# Patient Record
Sex: Female | Born: 1950 | Race: Black or African American | Hispanic: No | Smoking: Never smoker
Health system: Southern US, Community
[De-identification: ages and names within clinical notes are randomized; demographics above are authoritative.]

## PROBLEM LIST (undated history)

## (undated) DIAGNOSIS — N950 Postmenopausal bleeding: Secondary | ICD-10-CM

## (undated) DIAGNOSIS — R05 Cough: Secondary | ICD-10-CM

## (undated) DIAGNOSIS — Z8619 Personal history of other infectious and parasitic diseases: Secondary | ICD-10-CM

## (undated) DIAGNOSIS — M1990 Unspecified osteoarthritis, unspecified site: Secondary | ICD-10-CM

## (undated) DIAGNOSIS — Z9889 Other specified postprocedural states: Secondary | ICD-10-CM

## (undated) DIAGNOSIS — R112 Nausea with vomiting, unspecified: Secondary | ICD-10-CM

## (undated) DIAGNOSIS — I10 Essential (primary) hypertension: Secondary | ICD-10-CM

## (undated) DIAGNOSIS — Z8742 Personal history of other diseases of the female genital tract: Secondary | ICD-10-CM

## (undated) DIAGNOSIS — Z8739 Personal history of other diseases of the musculoskeletal system and connective tissue: Secondary | ICD-10-CM

## (undated) DIAGNOSIS — R32 Unspecified urinary incontinence: Secondary | ICD-10-CM

## (undated) DIAGNOSIS — E669 Obesity, unspecified: Secondary | ICD-10-CM

## (undated) DIAGNOSIS — B379 Candidiasis, unspecified: Secondary | ICD-10-CM

## (undated) DIAGNOSIS — R059 Cough, unspecified: Secondary | ICD-10-CM

## (undated) DIAGNOSIS — A499 Bacterial infection, unspecified: Secondary | ICD-10-CM

## (undated) DIAGNOSIS — I8390 Asymptomatic varicose veins of unspecified lower extremity: Secondary | ICD-10-CM

## (undated) DIAGNOSIS — N939 Abnormal uterine and vaginal bleeding, unspecified: Secondary | ICD-10-CM

## (undated) DIAGNOSIS — K219 Gastro-esophageal reflux disease without esophagitis: Secondary | ICD-10-CM

## (undated) DIAGNOSIS — R2241 Localized swelling, mass and lump, right lower limb: Secondary | ICD-10-CM

## (undated) DIAGNOSIS — E785 Hyperlipidemia, unspecified: Secondary | ICD-10-CM

## (undated) HISTORY — PX: BIOPSY BREAST: PRO8

## (undated) HISTORY — DX: Asymptomatic varicose veins of unspecified lower extremity: I83.90

## (undated) HISTORY — PX: WISDOM TOOTH EXTRACTION: SHX21

## (undated) HISTORY — DX: Candidiasis, unspecified: B37.9

## (undated) HISTORY — DX: Localized swelling, mass and lump, right lower limb: R22.41

## (undated) HISTORY — DX: Personal history of other infectious and parasitic diseases: Z86.19

## (undated) HISTORY — DX: Hyperlipidemia, unspecified: E78.5

## (undated) HISTORY — DX: Cough: R05

## (undated) HISTORY — PX: OTHER SURGICAL HISTORY: SHX169

## (undated) HISTORY — PX: BREAST REDUCTION SURGERY: SHX8

## (undated) HISTORY — DX: Obesity, unspecified: E66.9

## (undated) HISTORY — DX: Essential (primary) hypertension: I10

## (undated) HISTORY — DX: Unspecified urinary incontinence: R32

## (undated) HISTORY — DX: Cough, unspecified: R05.9

## (undated) HISTORY — DX: Personal history of other diseases of the female genital tract: Z87.42

## (undated) HISTORY — DX: Personal history of other diseases of the musculoskeletal system and connective tissue: Z87.39

## (undated) HISTORY — DX: Postmenopausal bleeding: N95.0

## (undated) HISTORY — DX: Abnormal uterine and vaginal bleeding, unspecified: N93.9

## (undated) HISTORY — DX: Bacterial infection, unspecified: A49.9

## (undated) HISTORY — PX: DILATION AND CURETTAGE OF UTERUS: SHX78

---

## 1975-02-21 HISTORY — PX: MYOMECTOMY ABDOMINAL APPROACH: SUR870

## 1987-02-21 HISTORY — PX: TUMOR REMOVAL: SHX12

## 1997-11-30 ENCOUNTER — Ambulatory Visit (HOSPITAL_COMMUNITY): Admission: RE | Admit: 1997-11-30 | Discharge: 1997-11-30 | Payer: Self-pay | Admitting: Specialist

## 1997-12-07 ENCOUNTER — Ambulatory Visit (HOSPITAL_BASED_OUTPATIENT_CLINIC_OR_DEPARTMENT_OTHER): Admission: RE | Admit: 1997-12-07 | Discharge: 1997-12-07 | Payer: Self-pay | Admitting: Specialist

## 1999-02-21 DIAGNOSIS — Z8742 Personal history of other diseases of the female genital tract: Secondary | ICD-10-CM

## 1999-02-21 HISTORY — DX: Personal history of other diseases of the female genital tract: Z87.42

## 1999-07-30 ENCOUNTER — Encounter: Payer: Self-pay | Admitting: Nephrology

## 1999-07-30 ENCOUNTER — Encounter: Admission: RE | Admit: 1999-07-30 | Discharge: 1999-07-30 | Payer: Self-pay | Admitting: Nephrology

## 1999-08-01 ENCOUNTER — Encounter: Payer: Self-pay | Admitting: Nephrology

## 1999-08-01 ENCOUNTER — Encounter: Admission: RE | Admit: 1999-08-01 | Discharge: 1999-08-01 | Payer: Self-pay | Admitting: Nephrology

## 1999-08-09 ENCOUNTER — Ambulatory Visit (HOSPITAL_COMMUNITY): Admission: RE | Admit: 1999-08-09 | Discharge: 1999-08-09 | Payer: Self-pay | Admitting: Nephrology

## 1999-08-09 ENCOUNTER — Encounter: Payer: Self-pay | Admitting: Nephrology

## 1999-10-04 ENCOUNTER — Encounter: Admission: RE | Admit: 1999-10-04 | Discharge: 1999-10-04 | Payer: Self-pay | Admitting: Nephrology

## 1999-10-04 ENCOUNTER — Encounter: Payer: Self-pay | Admitting: Nephrology

## 1999-10-26 ENCOUNTER — Encounter: Payer: Self-pay | Admitting: Nephrology

## 1999-10-26 ENCOUNTER — Encounter: Admission: RE | Admit: 1999-10-26 | Discharge: 1999-10-26 | Payer: Self-pay | Admitting: Nephrology

## 2000-01-04 ENCOUNTER — Encounter: Payer: Self-pay | Admitting: Emergency Medicine

## 2000-01-04 ENCOUNTER — Emergency Department (HOSPITAL_COMMUNITY): Admission: EM | Admit: 2000-01-04 | Discharge: 2000-01-04 | Payer: Self-pay | Admitting: Emergency Medicine

## 2000-02-21 DIAGNOSIS — Z8742 Personal history of other diseases of the female genital tract: Secondary | ICD-10-CM

## 2000-02-21 HISTORY — DX: Personal history of other diseases of the female genital tract: Z87.42

## 2000-09-27 ENCOUNTER — Other Ambulatory Visit: Admission: RE | Admit: 2000-09-27 | Discharge: 2000-09-27 | Payer: Self-pay | Admitting: Obstetrics and Gynecology

## 2000-12-27 ENCOUNTER — Other Ambulatory Visit: Admission: RE | Admit: 2000-12-27 | Discharge: 2000-12-27 | Payer: Self-pay | Admitting: Obstetrics and Gynecology

## 2004-02-21 DIAGNOSIS — Z8742 Personal history of other diseases of the female genital tract: Secondary | ICD-10-CM

## 2004-02-21 HISTORY — DX: Personal history of other diseases of the female genital tract: Z87.42

## 2004-09-16 ENCOUNTER — Other Ambulatory Visit: Admission: RE | Admit: 2004-09-16 | Discharge: 2004-09-16 | Payer: Self-pay | Admitting: Obstetrics and Gynecology

## 2007-01-02 LAB — CONVERTED CEMR LAB

## 2008-07-23 ENCOUNTER — Encounter: Payer: Self-pay | Admitting: Family Medicine

## 2008-09-15 ENCOUNTER — Encounter: Payer: Self-pay | Admitting: Family Medicine

## 2008-12-31 ENCOUNTER — Ambulatory Visit: Payer: Self-pay | Admitting: Family Medicine

## 2008-12-31 DIAGNOSIS — H918X9 Other specified hearing loss, unspecified ear: Secondary | ICD-10-CM

## 2008-12-31 DIAGNOSIS — E785 Hyperlipidemia, unspecified: Secondary | ICD-10-CM

## 2008-12-31 DIAGNOSIS — E119 Type 2 diabetes mellitus without complications: Secondary | ICD-10-CM

## 2008-12-31 DIAGNOSIS — I10 Essential (primary) hypertension: Secondary | ICD-10-CM | POA: Insufficient documentation

## 2009-01-01 LAB — CONVERTED CEMR LAB
ALT: 18 units/L (ref 0–35)
AST: 19 units/L (ref 0–37)
Albumin: 3.6 g/dL (ref 3.5–5.2)
Alkaline Phosphatase: 75 units/L (ref 39–117)
BUN: 10 mg/dL (ref 6–23)
Bilirubin, Direct: 0 mg/dL (ref 0.0–0.3)
CO2: 29 meq/L (ref 19–32)
Calcium: 9 mg/dL (ref 8.4–10.5)
Chloride: 101 meq/L (ref 96–112)
Cholesterol: 116 mg/dL (ref 0–200)
Creatinine, Ser: 0.9 mg/dL (ref 0.4–1.2)
GFR calc non Af Amer: 82.9 mL/min (ref >60)
Glucose, Bld: 207 mg/dL — ABNORMAL HIGH (ref 70–99)
HDL: 43.4 mg/dL (ref >39.00)
Hgb A1c MFr Bld: 7.8 % — ABNORMAL HIGH (ref 4.6–6.5)
LDL Cholesterol: 48 mg/dL (ref 0–99)
Potassium: 3.2 meq/L — ABNORMAL LOW (ref 3.5–5.1)
Sodium: 138 meq/L (ref 135–145)
Total Bilirubin: 0.7 mg/dL (ref 0.3–1.2)
Total CHOL/HDL Ratio: 3
Total Protein: 7.3 g/dL (ref 6.0–8.3)
Triglycerides: 122 mg/dL (ref 0.0–149.0)
VLDL: 24.4 mg/dL (ref 0.0–40.0)

## 2009-01-19 ENCOUNTER — Encounter: Payer: Self-pay | Admitting: Family Medicine

## 2009-03-10 ENCOUNTER — Encounter (INDEPENDENT_AMBULATORY_CARE_PROVIDER_SITE_OTHER): Payer: Self-pay | Admitting: *Deleted

## 2009-03-12 ENCOUNTER — Telehealth (INDEPENDENT_AMBULATORY_CARE_PROVIDER_SITE_OTHER): Payer: Self-pay | Admitting: *Deleted

## 2009-04-09 ENCOUNTER — Telehealth: Payer: Self-pay | Admitting: Family Medicine

## 2009-04-09 ENCOUNTER — Ambulatory Visit: Payer: Self-pay | Admitting: Family Medicine

## 2009-04-09 ENCOUNTER — Other Ambulatory Visit: Admission: RE | Admit: 2009-04-09 | Discharge: 2009-04-09 | Payer: Self-pay | Admitting: Family Medicine

## 2009-04-09 DIAGNOSIS — K069 Disorder of gingiva and edentulous alveolar ridge, unspecified: Secondary | ICD-10-CM

## 2009-04-09 DIAGNOSIS — Z862 Personal history of diseases of the blood and blood-forming organs and certain disorders involving the immune mechanism: Secondary | ICD-10-CM

## 2009-04-09 DIAGNOSIS — K056 Periodontal disease, unspecified: Secondary | ICD-10-CM | POA: Insufficient documentation

## 2009-04-09 DIAGNOSIS — Z8639 Personal history of other endocrine, nutritional and metabolic disease: Secondary | ICD-10-CM

## 2009-04-12 ENCOUNTER — Encounter (INDEPENDENT_AMBULATORY_CARE_PROVIDER_SITE_OTHER): Payer: Self-pay | Admitting: *Deleted

## 2009-04-12 LAB — CONVERTED CEMR LAB
BUN: 6 mg/dL (ref 6–23)
CO2: 30 meq/L (ref 19–32)
Calcium: 9.2 mg/dL (ref 8.4–10.5)
Chloride: 101 meq/L (ref 96–112)
Creatinine, Ser: 0.8 mg/dL (ref 0.4–1.2)
GFR calc non Af Amer: 94.88 mL/min (ref >60)
Glucose, Bld: 138 mg/dL — ABNORMAL HIGH (ref 70–99)
Hgb A1c MFr Bld: 7.8 % — ABNORMAL HIGH (ref 4.6–6.5)
Potassium: 3.5 meq/L (ref 3.5–5.1)
Sodium: 136 meq/L (ref 135–145)

## 2009-04-15 ENCOUNTER — Encounter (INDEPENDENT_AMBULATORY_CARE_PROVIDER_SITE_OTHER): Payer: Self-pay | Admitting: *Deleted

## 2009-04-15 LAB — CONVERTED CEMR LAB: Pap Smear: NEGATIVE

## 2009-04-21 ENCOUNTER — Telehealth: Payer: Self-pay | Admitting: Family Medicine

## 2009-04-26 ENCOUNTER — Encounter: Payer: Self-pay | Admitting: Family Medicine

## 2009-06-23 ENCOUNTER — Ambulatory Visit: Payer: Self-pay | Admitting: Family Medicine

## 2009-06-23 DIAGNOSIS — M546 Pain in thoracic spine: Secondary | ICD-10-CM

## 2009-07-09 ENCOUNTER — Ambulatory Visit: Payer: Self-pay | Admitting: Family Medicine

## 2009-07-09 ENCOUNTER — Ambulatory Visit: Payer: Self-pay | Admitting: Internal Medicine

## 2009-07-16 ENCOUNTER — Encounter: Payer: Self-pay | Admitting: Family Medicine

## 2009-08-30 ENCOUNTER — Ambulatory Visit: Payer: Self-pay | Admitting: Family Medicine

## 2009-08-30 ENCOUNTER — Telehealth: Payer: Self-pay | Admitting: Family Medicine

## 2009-08-30 DIAGNOSIS — J020 Streptococcal pharyngitis: Secondary | ICD-10-CM

## 2009-08-30 DIAGNOSIS — J029 Acute pharyngitis, unspecified: Secondary | ICD-10-CM | POA: Insufficient documentation

## 2009-08-30 LAB — CONVERTED CEMR LAB: Rapid Strep: POSITIVE

## 2009-09-21 ENCOUNTER — Encounter (INDEPENDENT_AMBULATORY_CARE_PROVIDER_SITE_OTHER): Payer: Self-pay | Admitting: *Deleted

## 2009-10-07 ENCOUNTER — Ambulatory Visit: Payer: Self-pay | Admitting: Family Medicine

## 2009-10-11 ENCOUNTER — Encounter: Payer: Self-pay | Admitting: Family Medicine

## 2009-11-09 ENCOUNTER — Telehealth: Payer: Self-pay | Admitting: Family Medicine

## 2009-11-10 ENCOUNTER — Encounter: Payer: Self-pay | Admitting: Family Medicine

## 2010-03-22 NOTE — Progress Notes (Signed)
----   Converted from flag ---- ---- 04/09/2009 10:57 AM, Ruthe Mannan MD wrote: Thanks [employer]  Please convert to note.    ---- 04/09/2009 10:57 AM, Carlton Adam wrote: Zella Ball refaxed the original request from November to her previous Drs office. She called and spoke to them also. MK  ---- 04/09/2009 9:39 AM, Ruthe Mannan MD wrote: Alvira Philips, Can you try to find out if her previous records have been sent yet? Thanks, Talia ------------------------------

## 2010-03-22 NOTE — Letter (Signed)
Summary: Generic Letter  [place] at [facility]  [address] [place], [place] [address]   Phone: [phone]  Fax: [phone]    10/11/2009  Gabrielle Green [address] [place], [place]  [address]  Dear Ms. Palazzola,  We have been unable to reach you by telephone.  Please call our office at your earliest convenience.  When calling ask to speak with Lowella Bandy, Medical Assistant for Dr. Dayton Martes.         Sincerely,      Ruthe Mannan, MD

## 2010-03-22 NOTE — Progress Notes (Signed)
Summary: needs note for jury duty  Phone Note Call from Patient Call back at Home Phone [phone]   Caller: Patient Call For: Ruthe Mannan MD Summary of Call: Pt is requesting a letter excusing her from jury duty due to her being a diabetic, with drops in her blood sugar requiring her to eat.  She needs note by 12/20/09. Initial call taken by: Lowella Petties CMA,  November 09, 2009 2:47 PM  Follow-up for Phone Call        Note on my desk. Ruthe Mannan MD  November 10, 2009 12:12 PM  Left message on machine at home for patient to return call.  Note for jury duty left at front desk.  Linde Gillis CMA Duncan Dull)  November 10, 2009 12:26 PM   Advised pt.              Lowella Petties CMA  November 10, 2009 4:20 PM

## 2010-03-22 NOTE — Miscellaneous (Signed)
Summary: BONE DENSITY  Clinical Lists Changes  Orders: Added new Test order of T-Bone Densitometry (77080) - Signed Added new Test order of T-Lumbar Vertebral Assessment (77082) - Signed 

## 2010-03-22 NOTE — Letter (Signed)
Summary: Out of Work  [employer] at [facility]  [address] [place], [place] [address]   Phone: [phone]  Fax: [phone]    November 10, 2009   Employee:  AMBRIA MAYFIELD    To Whom It May Concern:   For Medical reasons, please excuse Ms. Carthen from jury duty.  Has poorly controlled diabetes and requires frequent meals and blood sugar checks.   If you need additional information, please feel free to contact our office.         Sincerely,    Ruthe Mannan MD

## 2010-03-22 NOTE — Letter (Signed)
Summary: [place] No Show Letter  [place] at [facility]  [address] [place], [place] [address]   Phone: [phone]  Fax: [phone]    09/21/2009 MRN: [id number]  [facility] [employer] [address] [place], [place]  [address]   Dear Ms. Struthers,   Our records indicate that you missed your scheduled appointment with ___lab_________________ on _8.2.11___________.  Please contact this office to reschedule your appointment as soon as possible.  It is important that you keep your scheduled appointments with your physician, so we can provide you the best care possible.  Please be advised that there may be a charge for "no show" appointments.    Sincerely,   [place] at [facility]

## 2010-03-22 NOTE — Letter (Signed)
Summary: Results Follow up Letter  [place] at [facility]  [address] [place], [place] [address]   Phone: [phone]  Fax: [phone]    04/15/2009 MRN: [id number]    Gabrielle Green [address] [place], [place]  [address]    Dear Ms. Evitts,  The following are the results of your recent test(s):  Test         Result    Pap Smear:        Normal __X___  Not Normal _____ Comments:   Repeat in 2 years. ______________________________________________________ Cholesterol: LDL(Bad cholesterol):         Your goal is less than:         HDL (Good cholesterol):       Your goal is more than: Comments:  ______________________________________________________ Mammogram:        Normal _____  Not Normal _____ Comments:  ___________________________________________________________________ Hemoccult:        Normal _____  Not normal _______ Comments:    _____________________________________________________________________ Other Tests:    We routinely do not discuss normal results over the telephone.  If you desire a copy of the results, or you have any questions about this information we can discuss them at your next office visit.   Sincerely,    Ruthe Mannan,  M.D.  TA:lsf

## 2010-03-22 NOTE — Letter (Signed)
Summary: Results Follow up Letter  [place] at [facility]  [address] [place], [place] [address]   Phone: [phone]  Fax: [phone]    07/16/2009 MRN: [id number]  Gabrielle Green [address] [place], [place]  [address]  Dear Ms. Culbreth,  The following are the results of your recent test(s):  Test         Result    Pap Smear:        Normal _____  Not Normal _____ Comments: ______________________________________________________ Cholesterol: LDL(Bad cholesterol):         Your goal is less than:         HDL (Good cholesterol):       Your goal is more than: Comments:  ______________________________________________________ Mammogram:        Normal _____  Not Normal _____ Comments:  ___________________________________________________________________ Hemoccult:        Normal _____  Not normal _______ Comments:    _____________________________________________________________________ Other Tests: Your Bone Density test was normal.    We routinely do not discuss normal results over the telephone.  If you desire a copy of the results, or you have any questions about this information we can discuss them at your next office visit.   Sincerely,        Ruthe Mannan, MD

## 2010-03-22 NOTE — Progress Notes (Signed)
Summary: needs to change abx  Phone Note Call from Patient Call back at Home Phone [phone]   Caller: Patient Call For: Ruthe Mannan MD Summary of Call: Pt was seen this morning and given pcn for a sore throat.  This is causing nausea and pt is asking if something else can be called to [facility] [address]. Initial call taken by: Lowella Petties CMA,  August 30, 2009 4:13 PM  Follow-up for Phone Call        Patient advised as instructed via telephone.   Follow-up by: Linde Gillis CMA Duncan Dull),  August 31, 2009 7:58 AM    New/Updated Medications: AZITHROMYCIN 250 MG  TABS (AZITHROMYCIN) 2 by  mouth today and then 1 daily for 4 days Prescriptions: AZITHROMYCIN 250 MG  TABS (AZITHROMYCIN) 2 by  mouth today and then 1 daily for 4 days  #6 x 0   Entered and Authorized by:   Ruthe Mannan MD   Signed by:   Ruthe Mannan MD on 08/31/2009   Method used:   [facility]        [facility] [address]       [address], [address]       [place], [place]  [address]       Ph: [phone]       Fax: [phone]   RxID:   [id number]

## 2010-03-22 NOTE — Progress Notes (Signed)
Summary: wants phone call about letter  Phone Note Call from Patient Call back at Home Phone [phone]   Caller: Patient Call For: Ruthe Mannan MD Summary of Call: Patient called regarding letter that was sent to her about adding a medication. She wants a phone call back tomorrow (03-25-09) after 4 pm Initial call taken by: Melody Comas,  April 21, 2009 4:51 PM  Follow-up for Phone Call        spoke with patient.  She is okay with starting the additional diabetic medication that you suggested on her last labs.  I could never reach her by phone and had to send a letter.  Please send medication to [place], [employer].  Lab appointment is already scheduled. Follow-up by: Delilah Shan CMA (AAMA),  April 22, 2009 4:21 PM    New/Updated Medications: GLYBURIDE 2.5 MG TABS (GLYBURIDE) 1 tab by mouth daily. Prescriptions: GLYBURIDE 2.5 MG TABS (GLYBURIDE) 1 tab by mouth daily.  #30 x 3   Entered and Authorized by:   Ruthe Mannan MD   Signed by:   Ruthe Mannan MD on 04/26/2009   Method used:   [facility]        [facility] [address]       [address], [address]       [place], [place]  [address]       Ph: [phone]       Fax: [phone]   RxID:   [id number]

## 2010-03-22 NOTE — Letter (Signed)
Summary: Generic Letter  [place] at [facility]  [address] [place], [place] [address]   Phone: [phone]  Fax: [phone]    04/12/2009    Gabrielle Green [address] [place], [place]  [address]    Dear [name],   We were unable to reach you by phone.  Your A1C is still elevated at 7.8.  I would like to add a second diabetic medication if you are okay with that. You can either make an appt to discuss it or I can call you back to discuss.  Please call in and ask to speak to [name].  We need to recheck your blood in 3 months.  That lab appointment is scheduled for Jul 09, 2009 at 8:15 a.m. (non-fasting).  If this appointment is not convenient, please call in to the office to reschedule.  This is for lab work only.      Sincerely,   Ruthe Mannan,  M.D.  TA:lsf

## 2010-03-22 NOTE — Assessment & Plan Note (Signed)
Summary: SORE THROAT   Vital Signs:  Patient profile:   60 year old female Height:      65 inches Weight:      272.50 pounds BMI:     45.51 Temp:     101.4 degrees F oral Pulse rate:   80 / minute Pulse rhythm:   regular BP sitting:   110 / 70  (right arm) Cuff size:   large  Vitals Entered By: Linde Gillis CMA Duncan D[place]) (August 30, 2009 9:54 AM) CC: sore throat, body aches, ear pain   History of Present Illness: 60 yo here for sore throat, fever x 3 days. Tmax 101.5. Feels tired and achy. No cough, runny nose, wheezing or SOB.   Current Medications (verified): 1)  Metformin Hcl 500 Mg Tabs (Metformin Hcl) .... Take 1 Tablet By Mouth Two Times A Day 2)  Triamterene-Hctz 37.5-25 Mg Tabs (Triamterene-Hctz) .... Take 1 Tablet By Mouth Once A Day 3)  Simvastatin 40 Mg Tabs (Simvastatin) .... Take 1 Tablet By Mouth Once A Day 4)  Aspirin 81 Mg  Tabs (Aspirin) .... Take 1 Tablet By Mouth Once A Day 5)  Potassium Chloride Cr 10 Meq  Tbcr (Potassium Chloride) .Marland Kitchen.. 1 Tab By Mouth Daily X 1 Week. 6)  Diabeta 2.5 Mg Tabs (Glyburide) .Marland Kitchen.. 1 Tab By Mouth Daily. 7)  Penicillin V Potassium 500 Mg Tabs (Penicillin V Potassium) .Marland Kitchen.. 1 Tab By Mouth Three Times A Day X 10 Days 8)  Ibuprofen 600 Mg  Tabs (Ibuprofen) .Marland Kitchen.. 1 Tab By Mouth Three Times A Day As Needed Pain.  Allergies (verified): No Known Drug Allergies  Past History:  Past Medical History: Last updated: January 15, 2009 Diabetes mellitus, type II Hyperlipidemia Hypertension Obesity  Past Surgical History: Last updated: 01/15/2009 Breast reduction -1995  Family History: Last updated: 2009/01/15 Dad- had HTN, died of MI at age 69 Mom - alive, 68, h/o bresat CA in 76s, DM, h/o CVA, HTN  Social History: Last updated: 01-15-09 LIves with adopted son in [place], [place].  Teaching assitant for elementary school.   Never Smoked Alcohol use-no Drug use-no Regular exercise-no  Risk Factors: Exercise: no (2009-01-15)  Risk  Factors: Smoking Status: never (January 15, 2009)  Review of Systems      See HPI General:  Complains of fever and malaise. ENT:  Complains of sore throat; denies ear discharge, nasal congestion, and postnasal drainage. Resp:  Denies cough.  Physical Exam  General:  obese, very pleasant, NAD,obviously uncomfortable but non toxic. febrile Mouth:  pharyngeal erythema and pharyngeal exudate.   Lungs:  Normal respiratory effort, chest expands symmetrically. Lungs are clear to auscultation, no crackles or wheezes. Heart:  Normal rate and regular rhythm. S1 and S2 normal without gallop, murmur, click, rub or other extra sounds. Cervical Nodes:  R posterior LN enlarged.   Psych:  normally interactive, good eye contact, not anxious appearing, and not depressed appearing.     Impression & Recommendations:  Problem # 1:  STREPTOCOCCAL PHARYNGITIS (ICD-034.0) Assessment New Rapid strep positive with all cardinal symptoms of strep throat. Treat with 10 day course of PCN and Ibuprofen for pain, swelling and fever. Her updated medication list for this problem includes:    Aspirin 81 Mg Tabs (Aspirin) .Marland Kitchen... Take 1 tablet by mouth once a day    Penicillin V Potassium 500 Mg Tabs (Penicillin v potassium) .Marland Kitchen... 1 tab by mouth three times a day x 10 days    Ibuprofen 600 Mg Tabs (Ibuprofen) .Marland Kitchen... 1 tab by mouth  three times a day as needed pain.  Complete Medication List: 1)  Metformin Hcl 500 Mg Tabs (Metformin hcl) .... Take 1 tablet by mouth two times a day 2)  Triamterene-hctz 37.5-25 Mg Tabs (Triamterene-hctz) .... Take 1 tablet by mouth once a day 3)  Simvastatin 40 Mg Tabs (Simvastatin) .... Take 1 tablet by mouth once a day 4)  Aspirin 81 Mg Tabs (Aspirin) .... Take 1 tablet by mouth once a day 5)  Potassium Chloride Cr 10 Meq Tbcr (Potassium chloride) .Marland Kitchen.. 1 tab by mouth daily x 1 week. 6)  Diabeta 2.5 Mg Tabs (Glyburide) .Marland Kitchen.. 1 tab by mouth daily. 7)  Penicillin V Potassium 500 Mg Tabs  (Penicillin v potassium) .Marland Kitchen.. 1 tab by mouth three times a day x 10 days 8)  Ibuprofen 600 Mg Tabs (Ibuprofen) .Marland Kitchen.. 1 tab by mouth three times a day as needed pain.  Other Orders: Rapid Strep ([address]) Prescriptions: IBUPROFEN 600 MG  TABS (IBUPROFEN) 1 tab by mouth three times a day as needed pain.  #30 x 0   Entered and Authorized by:   Ruthe Mannan MD   Signed by:   Ruthe Mannan MD on 08/30/2009   Method used:   [facility]        [facility] [address]       [address], [address]       [place], [place]  [address]       Ph: [phone]       Fax: [phone]   RxID:   [id number] PENICILLIN V POTASSIUM 500 MG TABS (PENICILLIN V POTASSIUM) 1 tab by mouth three times a day x 10 days  #30 x 0   Entered and Authorized by:   Ruthe Mannan MD   Signed by:   Ruthe Mannan MD on 08/30/2009   Method used:   [facility]        [facility] [address]       [address], [address] [place] [address]       [place], [place]  [address]       Ph: [phone]       Fax: [phone]   RxID:   [id number]   Current Allergies (reviewed today): No known allergies   Laboratory Results    Other Tests  Rapid Strep: positive  Kit Test Internal QC: Positive   (Normal Range: Negative)

## 2010-03-22 NOTE — Assessment & Plan Note (Signed)
Summary: cpx/mk   Vital Signs:  Patient profile:   60 year old female Height:      65 inches Weight:      277.38 pounds BMI:     46.33 Temp:     98.7 degrees F oral Pulse rate:   84 / minute Pulse rhythm:   regular BP sitting:   132 / 82  (left arm) Cuff size:   large  Vitals Entered By: Delilah Shan CMA Duncan Dull) (April 09, 2009 9:03 AM) CC: CPX   History of Present Illness: 60 yo female here for CPX/pap with complaint of left gum abscess.  Left gum abscess- saw a dentist two days ago, placed her on PCN and given pain medication. Since then, has had fevers and chills, left side of face swollen and red.  Unable to get back into see dentist today.  No n/v/d.   DM-   On Metformin 500 mg two times a day.  No side effects.  Checks CBGs every other day, usually in evening- 100-120.  Does not usually check fasting.  .  No tingling or numbness in extremities. Careful with wearing shoes at all times.  Not currently on an ACEI, she is not sure if she had a reaction in past (still awaiting records from urgent care who started her on current antihypertensive). No blurred vision.  a2c in Novmeber 2010 was 7.8.  HTN- has been on Triamt/HCTZ 37.5 for years.  No adverse effects that she is aware of.  No CP, SOB, LE edema, or blurred vision.  HLD- has been on simvastain 40 mg for years.  No myalgias.  Stong FH of CAD- father died of MI at young age.  FLP at goal 12/2008.  Well woman- mammogram scheduled for tomorrow.  G0.  Never had a h/o abnormal paps. Last pap in 2008.  Has not been sexually active in years.  No FH cervical CA, ovarian, CA.   Current Medications (verified): 1)  Metformin Hcl 500 Mg Tabs (Metformin Hcl) .... Take 1 Tablet By Mouth Two Times A Day 2)  Triamterene-Hctz 37.5-25 Mg Tabs (Triamterene-Hctz) .... Take 1 Tablet By Mouth Once A Day 3)  Simvastatin 40 Mg Tabs (Simvastatin) .... Take 1 Tablet By Mouth Once A Day 4)  Aspirin 81 Mg  Tabs (Aspirin) .... Take 1 Tablet By  Mouth Once A Day 5)  Potassium Chloride Cr 10 Meq  Tbcr (Potassium Chloride) .Marland Kitchen.. 1 Tab By Mouth Daily X 1 Week. 6)  Cleocin 300 Mg Caps (Clindamycin Hcl) .... Tab By Mouth Q 6 Hours X 7 Days  Allergies (verified): No Known Drug Allergies  Review of Systems      See HPI General:  Complains of chills and fever; denies malaise. Eyes:  Denies blurring. ENT:  Denies difficulty swallowing. CV:  Denies chest pain or discomfort, difficulty breathing at night, swelling of feet, and swelling of hands. Resp:  Denies cough. GI:  Denies abdominal pain, nausea, and vomiting. GU:  Denies discharge and dysuria. Derm:  Denies rash. Neuro:  Denies headaches, sensation of room spinning, tingling, and visual disturbances. Psych:  Denies anxiety and depression.  Physical Exam  General:  obese, very pleasant, NAD,obviously uncomfortable but non toxic. Afebrile. Ears:  External ear exam shows no significant lesions or deformities.  Otoscopic examination reveals clear canals, tympanic membranes are intact bilaterally without bulging, retraction, inflammation or discharge. Hearing is grossly normal bilaterally. Mouth:  left side of jaw swollen, red, warm and tender to touch, left lower gum  swollen Breasts:  No mass, nodules, thickening, tenderness, bulging, retraction, inflamation, nipple discharge or skin changes noted.   Lungs:  Normal respiratory effort, chest expands symmetrically. Lungs are clear to auscultation, no crackles or wheezes. Heart:  Normal rate and regular rhythm. S1 and S2 normal without gallop, murmur, click, rub or other extra sounds. Abdomen:  Bowel sounds positive,abdomen soft and non-tender without masses, organomegaly or hernias noted. Genitalia:  Pelvic Exam:        External: normal female genitalia without lesions or masses        Vagina: normal without lesions or masses        Cervix: normal without lesions or masses        Adnexa: normal bimanual exam without masses or  fullness        Uterus: normal by palpation        Pap smear: performed Extremities:  No clubbing, cyanosis, edema, or deformity noted with normal full range of motion of all joints.   Skin:  Intact without suspicious lesions or rashes Psych:  normally interactive, good eye contact, not anxious appearing, and not depressed appearing.     Impression & Recommendations:  Problem # 1:  Preventive Health Care (ICD-V70.0) Reviewed preventive care protocols, scheduled due services, and updated immunizations Discussed nutrition, exercise, diet, and healthy lifestyle.  Pap today. Mammogram tomorrow. All other prevention UTD.  Problem # 2:  DIABETES MELLITUS, TYPE II (ICD-250.00) Assessment: Unchanged Recheck a1c today, continue Metformin 500 two times a day.  Her updated medication list for this problem includes:    Metformin Hcl 500 Mg Tabs (Metformin hcl) .Marland Kitchen... Take 1 tablet by mouth two times a day    Aspirin 81 Mg Tabs (Aspirin) .Marland Kitchen... Take 1 tablet by mouth once a day  Problem # 3:  HYPERTENSION (ICD-401.9) Assessment: Unchanged  Close to goal of <130/80.  Would like to add ACEI since she is a diabetic, never received old records.  Will ask front office staff to call about records. Her updated medication list for this problem includes:    Triamterene-hctz 37.5-25 Mg Tabs (Triamterene-hctz) .Marland Kitchen... 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  D/c PCN and start clindamycin as abscess has worsened with fevers since starting PCN.  Red flags given for immediate f/u over weekend.  Problem # 5:  HYPOKALEMIA, HX OF (ICD-V12.2) Assessment: New recheck BMET today.  Complete Medication List: 1)  Metformin Hcl 500 Mg Tabs (Metformin hcl) .... Take 1 tablet by mouth two times a day 2)  Triamterene-hctz 37.5-25 Mg Tabs (Triamterene-hctz) ....  Take 1 tablet by mouth once a day 3)  Simvastatin 40 Mg Tabs (Simvastatin) .... Take 1 tablet by mouth once a day 4)  Aspirin 81 Mg Tabs (Aspirin) .... Take 1 tablet by mouth once a day 5)  Potassium Chloride Cr 10 Meq Tbcr (Potassium chloride) .Marland Kitchen.. 1 tab by mouth daily x 1 week. 6)  Cleocin 300 Mg Caps (Clindamycin hcl) .... Tab by mouth q 6 hours x 7 days  Other Orders: Pap Smear, Thin Prep ( Collection of) (Q0091) TLB-A1C / Hgb A1C (Glycohemoglobin) (83036-A1C)  Patient Instructions: 1)  Please stop taking the penicillin and start taking Clindamycin as directed. 2)  Go to ER or urgent care over the weekend if you develop worsening fevers and chills. 3)  No n/v/d. Prescriptions: CLEOCIN 300 MG CAPS (CLINDAMYCIN HCL) tab  by mouth q 6 hours x 7 days  #28 x 0   Entered and Authorized by:   Ruthe Mannan MD   Signed by:   Ruthe Mannan MD on 04/09/2009   Method used:   [facility]ronically to        [facility] [address]       [address], [address]       [place], [place]  [address]       Ph: [phone]       Fax: [phone]   RxID:   [id number]   Current Allergies (reviewed today): No known allergies

## 2010-03-22 NOTE — Letter (Signed)
Summary: Generic Letter  [place] at [facility]  [address] [place], [place] [address]   Phone: [phone]  Fax: [phone]    03/10/2009    HADYN AZER [address] [place], [place]  [address]    Dear Ms. Berrios,  I have left a few messages asking that you return my call with no response.  Dr. Dayton Martes would like for you to make an appointment with her at your convenience.  Please call in to [phone] to schedule that appointment.   Sincerely,   [name] Fuquay CMA (AAMA)

## 2010-03-22 NOTE — Assessment & Plan Note (Signed)
Summary: REFILL MEDS / LFW   Vital Signs:  Patient profile:   60 year old female Height:      65 inches Weight:      283 pounds BMI:     47.26 Temp:     97.6 degrees F oral Pulse rate:   80 / minute Pulse rhythm:   regular BP sitting:   128 / 80  (left arm) Cuff size:   large  Vitals Entered By: Delilah Shan CMA Duncan Dull) (Jun 23, 2009 3:56 PM) CC: Refill meds.  (See HPI)   History of Present Illness: 60 yo female here for refill meds.   DM-   On Metformin 500 mg two times a day.  No side effects.  Checks CBGs every other day, usually in evening- 100-120.  Does not usually check fasting.  .  No tingling or numbness in extremities. Careful with wearing shoes at all times.  Not currently on an ACEI, she is not sure if she had a reaction in past (still awaiting records from urgent care who started her on current antihypertensive). No blurred vision.  a2c in February was 7.8.  Did not get her Glyburide that we sent for her.    HTN- has been on Triamt/HCTZ 37.5 for years.  No adverse effects that she is aware of.  No CP, SOB, LE edema, or blurred vision.  HLD- has been on simvastain 40 mg for years.  No myalgias.  Stong FH of CAD- father died of MI at young age.    Thoracic back pain- middle to right, around scapular region.  Has been going on for a year, comes a goes.  No muscle weakness, chest pain, diaphoresis or associated symptoms.  Current Medications (verified): 1)  Metformin Hcl 500 Mg Tabs (Metformin Hcl) .... Take 1 Tablet By Mouth Two Times A Day 2)  Triamterene-Hctz 37.5-25 Mg Tabs (Triamterene-Hctz) .... Take 1 Tablet By Mouth Once A Day 3)  Simvastatin 40 Mg Tabs (Simvastatin) .... Take 1 Tablet By Mouth Once A Day 4)  Aspirin 81 Mg  Tabs (Aspirin) .... Take 1 Tablet By Mouth Once A Day 5)  Potassium Chloride Cr 10 Meq  Tbcr (Potassium Chloride) .Marland Kitchen.. 1 Tab By Mouth Daily X 1 Week. 6)  Diabeta 2.5 Mg Tabs (Glyburide) .Marland Kitchen.. 1 Tab By Mouth Daily.  Allergies (verified): No  Known Drug Allergies  Review of Systems      See HPI General:  Denies chills and fever. CV:  Denies chest pain or discomfort and shortness of breath with exertion. Resp:  Denies shortness of breath. MS:  Complains of thoracic pain; denies joint pain, joint redness, joint swelling, loss of strength, and muscle weakness.  Physical Exam  General:  obese, very pleasant, NAD,obviously uncomfortable but non toxic.  Mouth:  left side of jaw swollen, red, warm and tender to touch, left lower gum swollen Lungs:  Normal respiratory effort, chest expands symmetrically. Lungs are clear to auscultation, no crackles or wheezes. Heart:  Normal rate and regular rhythm. S1 and S2 normal without gallop, murmur, click, rub or other extra sounds. Msk:  TTP over T4/T5 area, FROM of shoulder bilaterally. Psych:  normally interactive, good eye contact, not anxious appearing, and not depressed appearing.     Impression & Recommendations:  Problem # 1:  BACK PAIN, THORACIC REGION, CHRONIC (ICD-724.1) Assessment New ? possible compression fracture.  no known h/o osteopenia or osteoporosis.  History and physical seems consistent.  Will get DEXA scan. Her updated medication  list for this problem includes:    Aspirin 81 Mg Tabs (Aspirin) .Marland Kitchen... Take 1 tablet by mouth once a day  Orders: Radiology Referral (Radiology)  Problem # 2:  DIABETES MELLITUS, TYPE II (ICD-250.00) Assessment: Unchanged Refill meds, bring back in 3 months for a1c. The following medications were removed from the medication list:    Glyburide 2.5 Mg Tabs (Glyburide) .Marland Kitchen... 1 tab by mouth daily. Her updated medication list for this problem includes:    Metformin Hcl 500 Mg Tabs (Metformin hcl) .Marland Kitchen... Take 1 tablet by mouth two times a day    Aspirin 81 Mg Tabs (Aspirin) .Marland Kitchen... Take 1 tablet by mouth once a day    Diabeta 2.5 Mg Tabs (Glyburide) .Marland Kitchen... 1 tab by mouth daily.  Problem # 3:  HYPERTENSION (ICD-401.9) Assessment:  Unchanged Continue current meds. Her updated medication list for this problem includes:    Triamterene-hctz 37.5-25 Mg Tabs (Triamterene-hctz) .Marland Kitchen... Take 1 tablet by mouth once a day  Complete Medication List: 1)  Metformin Hcl 500 Mg Tabs (Metformin hcl) .... Take 1 tablet by mouth two times a day 2)  Triamterene-hctz 37.5-25 Mg Tabs (Triamterene-hctz) .... Take 1 tablet by mouth once a day 3)  Simvastatin 40 Mg Tabs (Simvastatin) .... Take 1 tablet by mouth once a day 4)  Aspirin 81 Mg Tabs (Aspirin) .... Take 1 tablet by mouth once a day 5)  Potassium Chloride Cr 10 Meq Tbcr (Potassium chloride) .Marland Kitchen.. 1 tab by mouth daily x 1 week. 6)  Diabeta 2.5 Mg Tabs (Glyburide) .Marland Kitchen.. 1 tab by mouth daily.  Patient Instructions: 1)  Please come back in 3 months for lab visit- a1c (250.00). Prescriptions: METFORMIN HCL 500 MG TABS (METFORMIN HCL) Take 1 tablet by mouth two times a day  #60 x 11   Entered and Authorized by:   Ruthe Mannan MD   Signed by:   Ruthe Mannan MD on 06/23/2009   Method used:   Electronically to        [facility] [address] (retail)       [address], [address]       [place], [place]  [address]       Ph: [phone]       Fax: [phone]   RxID:   [id number] DIABETA 2.5 MG TABS (GLYBURIDE) 1 tab by mouth daily.  #60 x 3   Entered and Authorized by:   Ruthe Mannan MD   Signed by:   Ruthe Mannan MD on 06/23/2009   Method used:   Electronically to        [facility] [address] (retail)       [address], [address]       [place], [place]  [address]       Ph: [phone]       Fax: [phone]   RxID:   [id number]   Current Allergies (reviewed today): No known allergies

## 2010-03-22 NOTE — Progress Notes (Signed)
----   Converted from flag ---- ---- 02/15/2009 11:46 AM, Delilah Shan CMA (AAMA) wrote: I have tried to phone her numerous times with no response and then I mailed a letter asking her to schedule the appointment.   ---- 02/15/2009 11:46 AM, Ruthe Mannan MD wrote: Does she have another appointment scheduled?  ---- 01/01/2009 12:04 PM, Delilah Shan CMA (AAMA) wrote: Please make sure patient schedules follow up labs from last time.  She wanted to wait until her CPX appt. today to do that. ------------------------------

## 2010-05-26 ENCOUNTER — Encounter: Payer: Self-pay | Admitting: Family Medicine

## 2010-05-26 LAB — HM COLONOSCOPY

## 2010-05-31 ENCOUNTER — Ambulatory Visit (INDEPENDENT_AMBULATORY_CARE_PROVIDER_SITE_OTHER): Payer: BC Managed Care – PPO | Admitting: Family Medicine

## 2010-05-31 ENCOUNTER — Encounter: Payer: Self-pay | Admitting: Family Medicine

## 2010-05-31 ENCOUNTER — Encounter: Payer: Self-pay | Admitting: *Deleted

## 2010-05-31 DIAGNOSIS — E119 Type 2 diabetes mellitus without complications: Secondary | ICD-10-CM

## 2010-05-31 DIAGNOSIS — E669 Obesity, unspecified: Secondary | ICD-10-CM | POA: Insufficient documentation

## 2010-05-31 DIAGNOSIS — E785 Hyperlipidemia, unspecified: Secondary | ICD-10-CM

## 2010-05-31 DIAGNOSIS — Z1211 Encounter for screening for malignant neoplasm of colon: Secondary | ICD-10-CM

## 2010-05-31 DIAGNOSIS — K219 Gastro-esophageal reflux disease without esophagitis: Secondary | ICD-10-CM

## 2010-05-31 DIAGNOSIS — I10 Essential (primary) hypertension: Secondary | ICD-10-CM

## 2010-05-31 LAB — HEPATIC FUNCTION PANEL
Albumin: 3.5 g/dL (ref 3.5–5.2)
Alkaline Phosphatase: 72 U/L (ref 39–117)
Bilirubin, Direct: 0.1 mg/dL (ref 0.0–0.3)
Total Bilirubin: 0.6 mg/dL (ref 0.3–1.2)
Total Protein: 7.2 g/dL (ref 6.0–8.3)

## 2010-05-31 LAB — BASIC METABOLIC PANEL
Calcium: 9.3 mg/dL (ref 8.4–10.5)
Creatinine, Ser: 0.9 mg/dL (ref 0.4–1.2)
GFR: 82.49 mL/min (ref >60.00)
Glucose, Bld: 152 mg/dL — ABNORMAL HIGH (ref 70–99)
Sodium: 139 mEq/L (ref 135–145)

## 2010-05-31 LAB — LIPID PANEL: HDL: 47 mg/dL (ref >39.00)

## 2010-05-31 MED ORDER — METFORMIN HCL 500 MG PO TABS: 500.0000 mg | ORAL_TABLET | Freq: Two times a day (BID) | ORAL | Status: DC

## 2010-05-31 MED ORDER — GLYBURIDE 2.5 MG PO TABS: 2.5000 mg | ORAL_TABLET | Freq: Every day | ORAL | Status: DC

## 2010-05-31 MED ORDER — OMEPRAZOLE 40 MG PO CPDR: 40.0000 mg | DELAYED_RELEASE_CAPSULE | Freq: Every day | ORAL | Status: DC

## 2010-05-31 MED ORDER — SIMVASTATIN 40 MG PO TABS: 40.0000 mg | ORAL_TABLET | Freq: Every day | ORAL | Status: DC

## 2010-05-31 MED ORDER — TRIAMTERENE-HCTZ 37.5-25 MG PO CAPS: 1.0000 | ORAL_CAPSULE | Freq: Every day | ORAL | Status: DC

## 2010-05-31 NOTE — Patient Instructions (Signed)
Please stop by to see Gabrielle Green on your way out. 

## 2010-05-31 NOTE — Assessment & Plan Note (Signed)
Continue current meds, recheck a1c today.

## 2010-05-31 NOTE — Assessment & Plan Note (Signed)
Stable.  I would prefer to start her on an ACEI, will check BMET today.

## 2010-05-31 NOTE — Progress Notes (Signed)
60 yo female here for refill meds.  DM-   On Metformin 500 mg two times a day, glyburide 2.5 mg daily.  No side effects.  Has not been checking CBGs regularly.  Does not usually check fasting.  .  No tingling or numbness in extremities. Careful with wearing shoes at all times.  Not currently on an ACEI, she is not sure if she had a reaction in past but she would prefer not to change it yet. No blurred vision.   Lab Results  Component Value Date   HGBA1C 7.6* 10/07/2009     HTN- has been on Triamt/HCTZ 37.5 for years.  No adverse effects that she is aware of.  No CP, SOB, LE edema, or blurred vision.  HLD- has been on simvastain 40 mg for years.  No myalgias.  Stong FH of CAD- father died of MI at young age.   Lab Results  Component Value Date   CHOL 116 12/31/2008   Lab Results  Component Value Date   HDL 43.40 12/31/2008   Lab Results  Component Value Date   LDLCALC 48 12/31/2008   Lab Results  Component Value Date   TRIG 122.0 12/31/2008   Lab Results  Component Value Date   CHOLHDL 3 12/31/2008    Lab Results  Component Value Date   ALT 18 12/31/2008   AST 19 12/31/2008   ALKPHOS 75 12/31/2008   BILITOT 0.7 12/31/2008    GERD- has had more symptoms lately.   Sometimes epigastric discomfort.  No n/v/d.   The PMH, PSH, Social History, Family History, Medications, and allergies have been reviewed in [facility], and have been updated if relevant.   Review of Systems       See HPI General:  Denies chills and fever. CV:  Denies chest pain or discomfort and shortness of breath with exertion. Resp:  Denies shortness of breath.  Physical Exam BP 122/80  Pulse 77  Temp(Src) 98.3 F (36.8 C) (Oral)  Ht 5\' 5"  (1.651 m)  Wt 282 lb (127.914 kg)  BMI 46.93 kg/m2  General:  obese, very pleasant, NAD HEENT:   Neck supple, non tender. Lungs:  Normal respiratory effort, chest expands symmetrically. Lungs are clear to auscultation, no crackles or wheezes. Heart:  Normal rate  and regular rhythm. S1 and S2 normal without gallop, murmur, click, rub or other extra sounds. Abd:  Soft, NT, pos BS Psych:  normally interactive, good eye contact, not anxious appearing, and not depressed appearing.

## 2010-05-31 NOTE — Assessment & Plan Note (Signed)
Deteriorated. Start Ompeprazole. Will also check lipase given her h/o DM but symptoms not typical for pancreatitis.

## 2010-05-31 NOTE — Assessment & Plan Note (Signed)
FLP today 

## 2010-06-03 ENCOUNTER — Encounter: Payer: Self-pay | Admitting: Family Medicine

## 2010-06-03 ENCOUNTER — Ambulatory Visit (INDEPENDENT_AMBULATORY_CARE_PROVIDER_SITE_OTHER): Payer: BC Managed Care – PPO | Admitting: Family Medicine

## 2010-06-03 DIAGNOSIS — J069 Acute upper respiratory infection, unspecified: Secondary | ICD-10-CM

## 2010-06-03 MED ORDER — AZITHROMYCIN 250 MG PO TABS: ORAL_TABLET | ORAL | Status: AC

## 2010-06-03 NOTE — Progress Notes (Signed)
SUBJECTIVE:  Gabrielle Green is a 60 y.o. female who complains of congestion, sneezing, sore throat, post nasal drip, dry cough and bilateral sinus pain for 3 days. She denies a history of chest pain, fevers, shortness of breath, vomiting and weakness and does not a history of asthma. Patient denies smoke cigarettes.   The PMH, PSH, Social History, Family History, Medications, and allergies have been reviewed in [facility], and have been updated if relevant.  OBJECTIVE: BP 140/70  Pulse 86  Temp(Src) 97.9 F (36.6 C) (Oral)  Ht 5\' 5"  (1.651 m)  Wt 284 lb 6.4 oz (129.003 kg)  BMI 47.33 kg/m2  She appears well, vital signs are as noted. Ears normal.  Throat and pharynx normal.  Neck supple. No adenopathy in the neck. Nose is congested. Sinuses non tender. The chest is clear, without wheezes or rales.  ASSESSMENT:  viral upper respiratory illness  PLAN: Symptomatic therapy suggested: push fluids, rest and return office visit prn if symptoms persist or worsen. Lack of antibiotic effectiveness discussed with her. Printed rx for Zpack (PCN allergy) to fill only if symptoms worsen since it is Friday. Call or return to clinic prn if these symptoms worsen or fail to improve as anticipated.

## 2010-06-03 NOTE — Patient Instructions (Signed)
Fill antibiotic prescription only if symptoms worsen or do not improve over next several days as discussed.  Drink lots of fluids.  Treat sympotmatically with Mucinex, nasal saline irrigation, and Tylenol/Ibuprofen. Also try claritin D or zyrtec D over the counter- two times a day as needed ( have to sign for them at pharmacy). You can use warm compresses.  Cough suppressant at night.

## 2010-06-10 ENCOUNTER — Encounter: Payer: Self-pay | Admitting: *Deleted

## 2010-06-10 LAB — FECAL OCCULT BLOOD, IMMUNOCHEMICAL

## 2010-06-10 NOTE — Progress Notes (Signed)
Letter mailed advising patient as instructed. 

## 2010-06-25 LAB — HM MAMMOGRAPHY

## 2010-06-28 ENCOUNTER — Other Ambulatory Visit: Payer: BC Managed Care – PPO

## 2010-07-05 ENCOUNTER — Telehealth: Payer: Self-pay | Admitting: Radiology

## 2010-07-05 NOTE — Telephone Encounter (Signed)
FYI Patient never returned ifob, LMOM with no response.

## 2010-12-06 ENCOUNTER — Other Ambulatory Visit: Payer: Self-pay | Admitting: *Deleted

## 2010-12-06 MED ORDER — TRIAMTERENE-HCTZ 37.5-25 MG PO TABS: 1.0000 | ORAL_TABLET | Freq: Every day | ORAL | Status: DC

## 2010-12-06 NOTE — Telephone Encounter (Signed)
Patient requested tablets instead of capsules.

## 2011-04-13 DIAGNOSIS — N950 Postmenopausal bleeding: Secondary | ICD-10-CM

## 2011-04-13 HISTORY — DX: Postmenopausal bleeding: N95.0

## 2011-05-16 ENCOUNTER — Encounter (INDEPENDENT_AMBULATORY_CARE_PROVIDER_SITE_OTHER): Payer: BC Managed Care – PPO | Admitting: Obstetrics and Gynecology

## 2011-05-16 DIAGNOSIS — N921 Excessive and frequent menstruation with irregular cycle: Secondary | ICD-10-CM

## 2011-05-26 ENCOUNTER — Encounter: Payer: BC Managed Care – PPO | Admitting: Obstetrics and Gynecology

## 2011-06-06 ENCOUNTER — Other Ambulatory Visit: Payer: Self-pay | Admitting: Family Medicine

## 2011-06-10 ENCOUNTER — Other Ambulatory Visit: Payer: Self-pay | Admitting: Obstetrics and Gynecology

## 2011-06-10 DIAGNOSIS — N921 Excessive and frequent menstruation with irregular cycle: Secondary | ICD-10-CM

## 2011-06-12 ENCOUNTER — Encounter: Payer: Self-pay | Admitting: Family Medicine

## 2011-06-12 ENCOUNTER — Encounter (INDEPENDENT_AMBULATORY_CARE_PROVIDER_SITE_OTHER): Payer: BC Managed Care – PPO

## 2011-06-12 ENCOUNTER — Ambulatory Visit (INDEPENDENT_AMBULATORY_CARE_PROVIDER_SITE_OTHER): Payer: BC Managed Care – PPO | Admitting: Family Medicine

## 2011-06-12 VITALS — BP 140/88 | HR 72 | Temp 98.0°F | Wt 284.0 lb

## 2011-06-12 DIAGNOSIS — R2240 Localized swelling, mass and lump, unspecified lower limb: Secondary | ICD-10-CM

## 2011-06-12 DIAGNOSIS — R229 Localized swelling, mass and lump, unspecified: Secondary | ICD-10-CM

## 2011-06-12 DIAGNOSIS — L989 Disorder of the skin and subcutaneous tissue, unspecified: Secondary | ICD-10-CM | POA: Insufficient documentation

## 2011-06-12 MED ORDER — SIMVASTATIN 40 MG PO TABS: ORAL_TABLET | ORAL | Status: DC

## 2011-06-12 MED ORDER — TRIAMTERENE-HCTZ 37.5-25 MG PO TABS: 1.0000 | ORAL_TABLET | Freq: Every day | ORAL | Status: DC

## 2011-06-12 MED ORDER — METFORMIN HCL 500 MG PO TABS: ORAL_TABLET | ORAL | Status: DC

## 2011-06-12 NOTE — Patient Instructions (Signed)
Please stop by to see Gabrielle Green on your way out to set up your ultrasound. I will call you as soon as I get the results.

## 2011-06-12 NOTE — Progress Notes (Signed)
Subjective:    Patient ID: Gabrielle Green, female    DOB: 1950-09-09, 61 y.o.   MRN: [id number]  HPI  61 yo with h/o DM, HTN here for right lower leg mass x 1-2 months.  Thought it would go away on its own but it has not.  Now painful. No erythema, redness, fever, nausea or vomiting. Did not start out as a cut or a pimple. Never had anything like this before.  No CP or SOB.  She is taking ASA 81 mg daily.  Patient Active Problem List  Diagnoses  . STREPTOCOCCAL PHARYNGITIS  . Type II or unspecified type diabetes mellitus without mention of complication, not stated as uncontrolled  . HYPERLIPIDEMIA  . HEARING LOSS, MILD, BILATERAL  . ESSENTIAL HYPERTENSION, BENIGN  . HYPERTENSION  . ACUTE PHARYNGITIS  . PERIODONTAL DISEASE  . BACK PAIN, THORACIC REGION, CHRONIC  . HYPOKALEMIA, HX OF  . Obesity  . GERD (gastroesophageal reflux disease)  . Skin lesion  . Leg mass   Past Medical History  Diagnosis Date  . Diabetes mellitus   . Hyperlipidemia   . Hypertension   . Obesity    Past Surgical History  Procedure Date  . Breast reduction surgery 1995   History  Substance Use Topics  . Smoking status: Never Smoker   . Smokeless tobacco: Not on file  . Alcohol Use: No   Family History  Problem Relation Age of Onset  . Diabetes Mother   . Hypertension Mother   . Cancer Mother     breast  . Stroke Mother   . Hypertension Sister    Allergies  Allergen Reactions  . Penicillins     vomiting   Current Outpatient Prescriptions on File Prior to Visit  Medication Sig Dispense Refill  . aspirin 81 MG tablet Take 81 mg by mouth daily.        Marland Kitchen glyBURIDE (DIABETA) 2.5 MG tablet Take 1 tablet (2.5 mg total) by mouth daily.  90 tablet  3  . ibuprofen (ADVIL,MOTRIN) 600 MG tablet Take 600 mg by mouth every 6 (six) hours as needed.        Marland Kitchen DISCONTD: metFORMIN (GLUCOPHAGE) 500 MG tablet TAKE ONE TABLET BY MOUTH TWICE DAILY WITH A MEAL  60 tablet  0  . DISCONTD: omeprazole  (PRILOSEC) 40 MG capsule Take 1 capsule (40 mg total) by mouth daily.  30 capsule  1  . DISCONTD: simvastatin (ZOCOR) 40 MG tablet TAKE ONE TABLET BY MOUTH AT BEDTIME  30 tablet  0  . DISCONTD: triamterene-hydrochlorothiazide (MAXZIDE-25) 37.5-25 MG per tablet Take 1 each (1 tablet total) by mouth daily.  90 tablet  3   The PMH, PSH, Social History, Family History, Medications, and allergies have been reviewed in [facility], and have been updated if relevant.   Review of Systems    See HPI Objective:   Physical Exam BP 140/88  Pulse 72  Temp(Src) 98 F (36.7 C) (Oral)  Wt 284 lb (128.822 kg)  General:  Well-developed,well-nourished,in no acute distress; alert,appropriate and cooperative throughout examination Head:  normocephalic and atraumatic.   Eyes:  vision grossly intact, pupils equal, pupils round, and pupils reactive to light.   Ears:  R ear normal and L ear normal.   Nose:  no external deformity.   Mouth:  good dentition.   Extremities: large oblong firm, non fluctuant mass right lower leg, TTP, no overlying or surrounding warmth/erythema. Neurologic:  alert & oriented X3 and gait normal.  Psych:  Cognition and judgment appear intact. Alert and cooperative with normal attention span and concentration. No apparent delusions, illusions, hallucinations        Assessment & Plan:   1. Leg mass  Lower Extremity Venous Reflux Right   New but has been there for over a month. ? DVT vs abscess. Will get venous doppler. If neg for blood clot, refer to surgery for excision/biopsy.  Advised to take her ASA today. The patient indicates understanding of these issues and agrees with the plan.

## 2011-06-13 ENCOUNTER — Other Ambulatory Visit: Payer: Self-pay | Admitting: Family Medicine

## 2011-06-13 DIAGNOSIS — R2240 Localized swelling, mass and lump, unspecified lower limb: Secondary | ICD-10-CM

## 2011-06-13 NOTE — Progress Notes (Signed)
Received call report that leg is neg for DVT.  Will refer to general surgery for biospy/excision/drainage of mass since it is growing and more painful.

## 2011-06-19 ENCOUNTER — Other Ambulatory Visit: Payer: BC Managed Care – PPO

## 2011-06-19 ENCOUNTER — Ambulatory Visit (INDEPENDENT_AMBULATORY_CARE_PROVIDER_SITE_OTHER): Payer: BC Managed Care – PPO

## 2011-06-19 ENCOUNTER — Other Ambulatory Visit: Payer: Self-pay | Admitting: Obstetrics and Gynecology

## 2011-06-19 ENCOUNTER — Encounter: Payer: BC Managed Care – PPO | Admitting: Obstetrics and Gynecology

## 2011-06-19 ENCOUNTER — Encounter: Payer: Self-pay | Admitting: Obstetrics and Gynecology

## 2011-06-19 ENCOUNTER — Ambulatory Visit (INDEPENDENT_AMBULATORY_CARE_PROVIDER_SITE_OTHER): Payer: BC Managed Care – PPO | Admitting: Obstetrics and Gynecology

## 2011-06-19 VITALS — BP 122/84 | Ht 65.0 in | Wt 288.0 lb

## 2011-06-19 DIAGNOSIS — N921 Excessive and frequent menstruation with irregular cycle: Secondary | ICD-10-CM

## 2011-06-19 DIAGNOSIS — N840 Polyp of corpus uteri: Secondary | ICD-10-CM

## 2011-06-19 DIAGNOSIS — N924 Excessive bleeding in the premenopausal period: Secondary | ICD-10-CM

## 2011-06-19 DIAGNOSIS — N83209 Unspecified ovarian cyst, unspecified side: Secondary | ICD-10-CM

## 2011-06-19 NOTE — Progress Notes (Signed)
Pt here for SHG and EMBX.  SHG done per protocol. EMBX done per protocol.   Findings c/w endometrial polyps and and right ovarian complex cyst.   Pt offered medical management, vs obs, vs sugical management.  Pt desires hysterectomy with BSO.  She understands that there is a small risk the ovary could be cancerous and will need another surgery if it is.  Will schedule

## 2011-06-22 ENCOUNTER — Telehealth: Payer: Self-pay | Admitting: Obstetrics and Gynecology

## 2011-06-22 LAB — PATHOLOGY

## 2011-07-04 ENCOUNTER — Other Ambulatory Visit: Payer: Self-pay | Admitting: Obstetrics and Gynecology

## 2011-07-10 ENCOUNTER — Ambulatory Visit (INDEPENDENT_AMBULATORY_CARE_PROVIDER_SITE_OTHER): Payer: BC Managed Care – PPO | Admitting: General Surgery

## 2011-07-10 ENCOUNTER — Encounter (INDEPENDENT_AMBULATORY_CARE_PROVIDER_SITE_OTHER): Payer: Self-pay | Admitting: General Surgery

## 2011-07-10 VITALS — BP 132/88 | HR 68 | Temp 96.7°F | Resp 16 | Ht 65.0 in | Wt 284.0 lb

## 2011-07-10 DIAGNOSIS — R2241 Localized swelling, mass and lump, right lower limb: Secondary | ICD-10-CM | POA: Insufficient documentation

## 2011-07-10 DIAGNOSIS — R229 Localized swelling, mass and lump, unspecified: Secondary | ICD-10-CM

## 2011-07-10 DIAGNOSIS — R2240 Localized swelling, mass and lump, unspecified lower limb: Secondary | ICD-10-CM

## 2011-07-10 NOTE — Progress Notes (Signed)
Patient ID: Gabrielle Green, female   DOB: Sep 21, 1950, 61 y.o.   MRN: [id number]  Chief Complaint  Patient presents with  . Mass    Leg    HPI Gabrielle Green is a 61 y.o. female.  Referred by Dr. Ruthe Mannan HPI This is a 61 year old female with multiple medical problems including morbid obesity and diabetes. A month ago she noticed a medial right leg mass. This area never been infected and doesn't really cause her any trouble except it is sore to touch. She states that this is causing circulation changes in her foot. She is due to get a hysterectomy on July 3. She had a duplex ultrasound of her lower shunt is showing she does not have a DVT. She comes in at this mass evaluated.  Past Medical History  Diagnosis Date  . Diabetes mellitus   . Hyperlipidemia   . Hypertension   . Obesity   . Leg mass, right   . Cough   . Vaginal bleeding     Past Surgical History  Procedure Date  . Breast reduction surgery 1995  . 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 Diabetes Mother   . Hypertension Mother   . Cancer Mother     breast  . Stroke Mother   . Hypertension Sister     Social History History  Substance Use Topics  . Smoking status: Not on file  . Smokeless tobacco: Not on file  . Alcohol Use: No    Allergies  Allergen Reactions  . Penicillins Nausea And Vomiting and Cough    Current Outpatient Prescriptions  Medication Sig Dispense Refill  . aspirin 81 MG tablet Take 81 mg by mouth daily.        . ergocalciferol (VITAMIN D2) 50000 UNITS capsule Take 50,000 Units by mouth once a week.      . glyBURIDE (DIABETA) 2.5 MG tablet Take 1 tablet (2.5 mg total) by mouth daily.  90 tablet  3  . ibuprofen (ADVIL,MOTRIN) 600 MG tablet Take 600 mg by mouth every 6 (six) hours as needed.        . metFORMIN (GLUCOPHAGE) 500 MG tablet TAKE ONE TABLET BY MOUTH TWICE DAILY WITH A MEAL  180 tablet  3  . omeprazole (PRILOSEC) 40 MG capsule  Take 40 mg by mouth as needed.       . simvastatin (ZOCOR) 40 MG tablet TAKE ONE TABLET BY MOUTH AT BEDTIME  90 tablet  3  . triamterene-hydrochlorothiazide (MAXZIDE-25) 37.5-25 MG per tablet Take 1 each (1 tablet total) by mouth daily.  90 tablet  3    Review of Systems Review of Systems  Constitutional: Negative for fever, chills and unexpected weight change.  HENT: Negative for hearing loss, congestion, sore throat, trouble swallowing and voice change.   Eyes: Negative for visual disturbance.  Respiratory: Positive for cough. Negative for wheezing.   Cardiovascular: Negative for chest pain, palpitations and leg swelling.  Gastrointestinal: Negative for nausea, vomiting, abdominal pain, diarrhea, constipation, blood in stool, abdominal distention and anal bleeding.  Genitourinary: Negative for hematuria, vaginal bleeding and difficulty urinating.  Musculoskeletal: Negative for arthralgias.  Skin: Negative for rash and wound.  Neurological: Negative for seizures, syncope and headaches.  Hematological: Negative for adenopathy. Does not bruise/bleed easily.  Psychiatric/Behavioral: Negative for confusion.    Blood pressure 132/88, pulse 68, temperature 96.7 F (35.9 C), temperature source Temporal, resp. rate  16, height 5\' 5"  (1.651 m), weight 284 lb (128.822 kg).  Physical Exam Physical Exam  Vitals reviewed. Constitutional: She appears well-developed and well-nourished.  Cardiovascular:  Pulses:      Dorsalis pedis pulses are 2+ on the right side.       Posterior tibial pulses are 2+ on the right side.  Musculoskeletal:       Legs:   Data Reviewed Duplex u/s le negative for dvt  Assessment    Right leg mass CVI    Plan    I don't think any of the symptoms she reports her lower extremities is related to this mass. I think this is primarily due to her chronic venous insufficiency, obesity, and diabetes. There is a mass that is present. I think this is most likely a  benign mass. We discussed excising this area and the difficulties with the wound in that region for her. We also discussed obtaining an ultrasound of this lesion it appears to be a benign lesion we could just go ahead and watch it for now. We're to get the ultrasound and I'll follow up with her after that.       Swain Acree 07/10/2011, 3:58 PM

## 2011-07-11 ENCOUNTER — Telehealth: Payer: Self-pay | Admitting: Obstetrics and Gynecology

## 2011-07-11 NOTE — Telephone Encounter (Signed)
[facility] BSO possTAH; Cystoscopy scheduled for 08/23/11 @ 9:30 with ND/AR.  [employer] effective 08/21/11; plan pays 70/30 after a $933 deductible. Pre-op due $558.91 -ap

## 2011-07-15 ENCOUNTER — Other Ambulatory Visit: Payer: Self-pay | Admitting: Family Medicine

## 2011-08-04 ENCOUNTER — Encounter: Payer: Self-pay | Admitting: Obstetrics and Gynecology

## 2011-08-04 ENCOUNTER — Ambulatory Visit (INDEPENDENT_AMBULATORY_CARE_PROVIDER_SITE_OTHER): Payer: BC Managed Care – PPO | Admitting: Obstetrics and Gynecology

## 2011-08-04 ENCOUNTER — Encounter: Payer: BC Managed Care – PPO | Admitting: Obstetrics and Gynecology

## 2011-08-04 VITALS — BP 122/70 | HR 84 | Temp 97.8°F | Resp 16 | Ht 65.0 in | Wt 281.0 lb

## 2011-08-04 DIAGNOSIS — I10 Essential (primary) hypertension: Secondary | ICD-10-CM | POA: Insufficient documentation

## 2011-08-04 DIAGNOSIS — N898 Other specified noninflammatory disorders of vagina: Secondary | ICD-10-CM

## 2011-08-04 DIAGNOSIS — Z01818 Encounter for other preprocedural examination: Secondary | ICD-10-CM

## 2011-08-04 DIAGNOSIS — E785 Hyperlipidemia, unspecified: Secondary | ICD-10-CM | POA: Insufficient documentation

## 2011-08-04 DIAGNOSIS — N939 Abnormal uterine and vaginal bleeding, unspecified: Secondary | ICD-10-CM

## 2011-08-04 NOTE — Patient Instructions (Signed)
Total Laparoscopic Hysterectomy and salpingo-oophorectomy
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
## 2011-08-07 ENCOUNTER — Other Ambulatory Visit: Payer: BC Managed Care – PPO
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
## 2011-08-11 ENCOUNTER — Encounter (HOSPITAL_COMMUNITY): Payer: Self-pay | Admitting: Pharmacist

## 2011-08-16 ENCOUNTER — Encounter (HOSPITAL_COMMUNITY): Payer: Self-pay

## 2011-08-16 ENCOUNTER — Encounter (HOSPITAL_COMMUNITY)
Admission: RE | Admit: 2011-08-16 | Discharge: 2011-08-16 | Disposition: A | Discharge status: Home / Self Care | Payer: BC Managed Care – PPO | Source: Ambulatory Visit | Attending: Obstetrics and Gynecology | Admitting: Obstetrics and Gynecology

## 2011-08-16 HISTORY — DX: Other specified postprocedural states: R11.2

## 2011-08-16 HISTORY — DX: Unspecified osteoarthritis, unspecified site: M19.90

## 2011-08-16 HISTORY — DX: Gastro-esophageal reflux disease without esophagitis: K21.9

## 2011-08-16 HISTORY — DX: Other specified postprocedural states: Z98.890

## 2011-08-16 LAB — CBC
MCHC: 33.2 g/dL (ref 30.0–36.0)
MCV: 82.4 fL (ref 78.0–100.0)
Platelets: 296 10*3/uL (ref 150–400)
RDW: 13.9 % (ref 11.5–15.5)
WBC: 5.7 10*3/uL (ref 4.0–10.5)

## 2011-08-16 LAB — COMPREHENSIVE METABOLIC PANEL
AST: 19 U/L (ref 0–37)
Albumin: 3.5 g/dL (ref 3.5–5.2)
BUN: 11 mg/dL (ref 6–23)
Chloride: 100 mEq/L (ref 96–112)
Creatinine, Ser: 0.73 mg/dL (ref 0.50–1.10)
Potassium: 3.3 mEq/L — ABNORMAL LOW (ref 3.5–5.1)
Total Bilirubin: 0.4 mg/dL (ref 0.3–1.2)
Total Protein: 7.4 g/dL (ref 6.0–8.3)

## 2011-08-16 LAB — SURGICAL PCR SCREEN: Staphylococcus aureus: NEGATIVE

## 2011-08-16 NOTE — Patient Instructions (Addendum)
   Your procedure is scheduled on: Wednesday July 3rd  Enter through the [employer] of [facility] at:8am Pick up the phone at the desk and dial [phone] and inform us of your arrival.  Please call this number if you have any problems the morning of surgery: [phone]  Remember: Do not eat food after midnight: Tuesday Do not drink clear liquids after:midnight Tuesday Take these medicines the morning of surgery with a SIP OF WATER: take morning of surgery--Omeprazone, Triamterene-hydrochlorthiazide, Hold your glyburide and metformin morning of surgery  Do not wear jewelry, make-up, or FINGER nail polish No metal in your hair or on your body. Do not wear lotions, powders, perfumes or deodorant. Do not shave 48 hours prior to surgery. Do not bring valuables to the hospital. Contacts, dentures or bridgework may not be worn into surgery.  Leave suitcase in the car. After Surgery it may be brought to your room. For patients being admitted to the hospital, checkout time is 11:00am the day of discharge.  Patients discharged on the day of surgery will not be allowed to drive home.     Remember to use your hibiclens as instructed.Please shower with 1/2 bottle the evening before your surgery and the other 1/2 bottle the morning of surgery. Neck down avoiding private area.

## 2011-08-16 NOTE — Pre-Procedure Instructions (Addendum)
Pt PCP-[facility]-Dr Jovita Gamma Aron-office called for EKG-pt has not had one. Pt does not remember if she has had EKG-will do ekg today at pat visit

## 2011-08-23 ENCOUNTER — Encounter (HOSPITAL_COMMUNITY): Payer: Self-pay | Admitting: *Deleted

## 2011-08-23 ENCOUNTER — Encounter (HOSPITAL_COMMUNITY)
Admission: RE | Disposition: A | Discharge status: Home / Self Care | Payer: Self-pay | Source: Ambulatory Visit | Attending: Obstetrics and Gynecology

## 2011-08-23 ENCOUNTER — Ambulatory Visit (HOSPITAL_COMMUNITY): Payer: BC Managed Care – PPO | Admitting: Anesthesiology

## 2011-08-23 ENCOUNTER — Encounter (HOSPITAL_COMMUNITY): Payer: Self-pay | Admitting: Anesthesiology

## 2011-08-23 ENCOUNTER — Inpatient Hospital Stay (HOSPITAL_COMMUNITY)
Admission: RE | Admit: 2011-08-23 | Discharge: 2011-08-25 | DRG: 359 | Disposition: A | Discharge status: Home / Self Care | Payer: BC Managed Care – PPO | Source: Ambulatory Visit | Attending: Obstetrics and Gynecology | Admitting: Obstetrics and Gynecology

## 2011-08-23 DIAGNOSIS — N840 Polyp of corpus uteri: Secondary | ICD-10-CM | POA: Diagnosis present

## 2011-08-23 DIAGNOSIS — D279 Benign neoplasm of unspecified ovary: Secondary | ICD-10-CM

## 2011-08-23 DIAGNOSIS — E119 Type 2 diabetes mellitus without complications: Secondary | ICD-10-CM | POA: Diagnosis present

## 2011-08-23 DIAGNOSIS — Z5331 Laparoscopic surgical procedure converted to open procedure: Secondary | ICD-10-CM

## 2011-08-23 DIAGNOSIS — Z01812 Encounter for preprocedural laboratory examination: Secondary | ICD-10-CM

## 2011-08-23 DIAGNOSIS — Z90710 Acquired absence of both cervix and uterus: Secondary | ICD-10-CM

## 2011-08-23 DIAGNOSIS — N736 Female pelvic peritoneal adhesions (postinfective): Secondary | ICD-10-CM

## 2011-08-23 DIAGNOSIS — D259 Leiomyoma of uterus, unspecified: Secondary | ICD-10-CM

## 2011-08-23 DIAGNOSIS — Z01818 Encounter for other preprocedural examination: Secondary | ICD-10-CM

## 2011-08-23 DIAGNOSIS — N83209 Unspecified ovarian cyst, unspecified side: Secondary | ICD-10-CM | POA: Diagnosis present

## 2011-08-23 DIAGNOSIS — I10 Essential (primary) hypertension: Secondary | ICD-10-CM | POA: Diagnosis present

## 2011-08-23 DIAGNOSIS — N950 Postmenopausal bleeding: Principal | ICD-10-CM | POA: Diagnosis present

## 2011-08-23 DIAGNOSIS — K219 Gastro-esophageal reflux disease without esophagitis: Secondary | ICD-10-CM | POA: Diagnosis present

## 2011-08-23 HISTORY — PX: LAPAROSCOPIC HYSTERECTOMY: SHX1926

## 2011-08-23 HISTORY — PX: CYSTOSCOPY: SHX5120

## 2011-08-23 HISTORY — PX: ABDOMINAL HYSTERECTOMY: SHX81

## 2011-08-23 LAB — GLUCOSE, CAPILLARY
Glucose-Capillary: 161 mg/dL — ABNORMAL HIGH (ref 70–99)
Glucose-Capillary: 211 mg/dL — ABNORMAL HIGH (ref 70–99)

## 2011-08-23 LAB — PREPARE RBC (CROSSMATCH)

## 2011-08-23 SURGERY — HYSTERECTOMY, TOTAL, LAPAROSCOPIC
Anesthesia: General | Site: Urethra | Wound class: Clean Contaminated

## 2011-08-23 MED ORDER — HYDROCODONE-ACETAMINOPHEN 5-325 MG PO TABS: 1.0000 | ORAL_TABLET | ORAL | Status: DC | PRN

## 2011-08-23 MED ORDER — LIDOCAINE HCL (CARDIAC) 20 MG/ML IV SOLN
INTRAVENOUS | Status: DC | PRN
  Administered 2011-08-23 (×2): 50 mg via INTRAVENOUS

## 2011-08-23 MED ORDER — HYDROMORPHONE HCL PF 1 MG/ML IJ SOLN
INTRAMUSCULAR | Status: AC
  Filled 2011-08-23: qty 1

## 2011-08-23 MED ORDER — SCOPOLAMINE 1 MG/3DAYS TD PT72
MEDICATED_PATCH | TRANSDERMAL | Status: AC
  Administered 2011-08-23: 1.5 mg via TRANSDERMAL
  Filled 2011-08-23: qty 1

## 2011-08-23 MED ORDER — HYDROMORPHONE 0.3 MG/ML IV SOLN
INTRAVENOUS | Status: DC
  Administered 2011-08-23: 2.7 mg via INTRAVENOUS
  Administered 2011-08-23: 18:00:00 via INTRAVENOUS
  Administered 2011-08-24: 0.3 mg via INTRAVENOUS
  Administered 2011-08-24: 0.9 mg via INTRAVENOUS
  Filled 2011-08-23: qty 25

## 2011-08-23 MED ORDER — VASOPRESSIN 20 UNIT/ML IJ SOLN
INTRAVENOUS | Status: DC | PRN
  Administered 2011-08-23: 14:00:00 via INTRAMUSCULAR

## 2011-08-23 MED ORDER — PANTOPRAZOLE SODIUM 40 MG PO TBEC
80.0000 mg | DELAYED_RELEASE_TABLET | Freq: Every day | ORAL | Status: DC
  Filled 2011-08-23 (×2): qty 2

## 2011-08-23 MED ORDER — SCOPOLAMINE 1 MG/3DAYS TD PT72
1.0000 | MEDICATED_PATCH | TRANSDERMAL | Status: DC
  Administered 2011-08-23: 1.5 mg via TRANSDERMAL

## 2011-08-23 MED ORDER — PROPOFOL 10 MG/ML IV EMUL
INTRAVENOUS | Status: AC
  Filled 2011-08-23: qty 20

## 2011-08-23 MED ORDER — MENTHOL 3 MG MT LOZG: 1.0000 | LOZENGE | OROMUCOSAL | Status: DC | PRN

## 2011-08-23 MED ORDER — METOCLOPRAMIDE HCL 5 MG/ML IJ SOLN: 10.0000 mg | Freq: Once | INTRAMUSCULAR | Status: DC | PRN

## 2011-08-23 MED ORDER — INSULIN ASPART 100 UNIT/ML ~~LOC~~ SOLN
0.0000 [IU] | Freq: Three times a day (TID) | SUBCUTANEOUS | Status: DC
  Administered 2011-08-24 (×2): 3 [IU] via SUBCUTANEOUS
  Administered 2011-08-25: 2 [IU] via SUBCUTANEOUS

## 2011-08-23 MED ORDER — KETOROLAC TROMETHAMINE 30 MG/ML IJ SOLN
30.0000 mg | Freq: Four times a day (QID) | INTRAMUSCULAR | Status: DC
  Administered 2011-08-23 – 2011-08-24 (×3): 30 mg via INTRAVENOUS
  Filled 2011-08-23 (×3): qty 1

## 2011-08-23 MED ORDER — FENTANYL CITRATE 0.05 MG/ML IJ SOLN
INTRAMUSCULAR | Status: AC
  Filled 2011-08-23: qty 5

## 2011-08-23 MED ORDER — DIPHENHYDRAMINE HCL 50 MG/ML IJ SOLN: 12.5000 mg | Freq: Four times a day (QID) | INTRAMUSCULAR | Status: DC | PRN

## 2011-08-23 MED ORDER — GLYCOPYRROLATE 0.2 MG/ML IJ SOLN
INTRAMUSCULAR | Status: DC | PRN
  Administered 2011-08-23: 0.3 mg via INTRAVENOUS

## 2011-08-23 MED ORDER — DIPHENHYDRAMINE HCL 12.5 MG/5ML PO ELIX: 12.5000 mg | ORAL_SOLUTION | Freq: Four times a day (QID) | ORAL | Status: DC | PRN

## 2011-08-23 MED ORDER — KETOROLAC TROMETHAMINE 30 MG/ML IJ SOLN
30.0000 mg | Freq: Four times a day (QID) | INTRAMUSCULAR | Status: DC
Start: 2011-08-23 — End: 2011-08-25

## 2011-08-23 MED ORDER — HYDROMORPHONE HCL PF 1 MG/ML IJ SOLN
INTRAMUSCULAR | Status: DC | PRN
  Administered 2011-08-23: 1 mg via INTRAVENOUS

## 2011-08-23 MED ORDER — GLYCOPYRROLATE 0.2 MG/ML IJ SOLN
INTRAMUSCULAR | Status: AC
  Filled 2011-08-23: qty 1

## 2011-08-23 MED ORDER — ONDANSETRON HCL 4 MG/2ML IJ SOLN: 4.0000 mg | Freq: Four times a day (QID) | INTRAMUSCULAR | Status: DC | PRN

## 2011-08-23 MED ORDER — ROCURONIUM BROMIDE 50 MG/5ML IV SOLN
INTRAVENOUS | Status: AC
  Filled 2011-08-23: qty 1

## 2011-08-23 MED ORDER — TRIAMTERENE-HCTZ 37.5-25 MG PO TABS
1.0000 | ORAL_TABLET | Freq: Every day | ORAL | Status: DC
  Administered 2011-08-24: 1 via ORAL
  Filled 2011-08-23 (×2): qty 1

## 2011-08-23 MED ORDER — NEOSTIGMINE METHYLSULFATE 1 MG/ML IJ SOLN
INTRAMUSCULAR | Status: DC | PRN
  Administered 2011-08-23: 3 mg via INTRAVENOUS

## 2011-08-23 MED ORDER — FENTANYL CITRATE 0.05 MG/ML IJ SOLN
INTRAMUSCULAR | Status: DC | PRN
  Administered 2011-08-23: 50 ug via INTRAVENOUS
  Administered 2011-08-23 (×2): 100 ug via INTRAVENOUS
  Administered 2011-08-23 (×2): 50 ug via INTRAVENOUS
  Administered 2011-08-23: 100 ug via INTRAVENOUS
  Administered 2011-08-23: 50 ug via INTRAVENOUS

## 2011-08-23 MED ORDER — NALOXONE HCL 0.4 MG/ML IJ SOLN: 0.4000 mg | INTRAMUSCULAR | Status: DC | PRN

## 2011-08-23 MED ORDER — STERILE WATER FOR IRRIGATION IR SOLN
Status: DC | PRN
  Administered 2011-08-23: 1000 mL

## 2011-08-23 MED ORDER — SIMVASTATIN 40 MG PO TABS
40.0000 mg | ORAL_TABLET | Freq: Every day | ORAL | Status: DC
  Administered 2011-08-23 – 2011-08-24 (×2): 40 mg via ORAL
  Filled 2011-08-23 (×2): qty 1

## 2011-08-23 MED ORDER — ZOLPIDEM TARTRATE 5 MG PO TABS: 5.0000 mg | ORAL_TABLET | Freq: Every evening | ORAL | Status: DC | PRN

## 2011-08-23 MED ORDER — GLYBURIDE 2.5 MG PO TABS
2.5000 mg | ORAL_TABLET | Freq: Every day | ORAL | Status: DC
  Administered 2011-08-24: 2.5 mg via ORAL
  Filled 2011-08-23 (×2): qty 1

## 2011-08-23 MED ORDER — LACTATED RINGERS IV SOLN
INTRAVENOUS | Status: DC
  Administered 2011-08-23 (×4): via INTRAVENOUS

## 2011-08-23 MED ORDER — INDIGOTINDISULFONATE SODIUM 8 MG/ML IJ SOLN
INTRAMUSCULAR | Status: DC | PRN
  Administered 2011-08-23: 40 mg via INTRAVENOUS

## 2011-08-23 MED ORDER — ROCURONIUM BROMIDE 100 MG/10ML IV SOLN
INTRAVENOUS | Status: DC | PRN
  Administered 2011-08-23: 5 mg via INTRAVENOUS
  Administered 2011-08-23: 20 mg via INTRAVENOUS
  Administered 2011-08-23: 10 mg via INTRAVENOUS
  Administered 2011-08-23: 15 mg via INTRAVENOUS
  Administered 2011-08-23: 40 mg via INTRAVENOUS
  Administered 2011-08-23: 15 mg via INTRAVENOUS
  Administered 2011-08-23: 10 mg via INTRAVENOUS

## 2011-08-23 MED ORDER — HYDROMORPHONE HCL PF 1 MG/ML IJ SOLN
0.2500 mg | INTRAMUSCULAR | Status: DC | PRN
  Administered 2011-08-23: 0.25 mg via INTRAVENOUS

## 2011-08-23 MED ORDER — INDIGOTINDISULFONATE SODIUM 8 MG/ML IJ SOLN
INTRAMUSCULAR | Status: AC
  Filled 2011-08-23: qty 5

## 2011-08-23 MED ORDER — BUPIVACAINE HCL (PF) 0.25 % IJ SOLN
INTRAMUSCULAR | Status: AC
  Filled 2011-08-23: qty 30

## 2011-08-23 MED ORDER — METFORMIN HCL 500 MG PO TABS
500.0000 mg | ORAL_TABLET | Freq: Every day | ORAL | Status: DC
Start: 2011-08-24 — End: 2011-08-25
  Administered 2011-08-24 – 2011-08-25 (×2): 500 mg via ORAL
  Filled 2011-08-23 (×2): qty 1

## 2011-08-23 MED ORDER — LACTATED RINGERS IR SOLN
Status: DC | PRN
  Administered 2011-08-23: 3000 mL

## 2011-08-23 MED ORDER — GENTAMICIN SULFATE 40 MG/ML IJ SOLN
INTRAVENOUS | Status: AC
  Administered 2011-08-23: 100 mL via INTRAVENOUS
  Filled 2011-08-23: qty 10.71

## 2011-08-23 MED ORDER — BUPIVACAINE HCL (PF) 0.25 % IJ SOLN
INTRAMUSCULAR | Status: DC | PRN
  Administered 2011-08-23: 8 mL

## 2011-08-23 MED ORDER — PROPOFOL 10 MG/ML IV EMUL
INTRAVENOUS | Status: DC | PRN
  Administered 2011-08-23: 200 mg via INTRAVENOUS

## 2011-08-23 MED ORDER — SODIUM CHLORIDE 0.9 % IJ SOLN: 9.0000 mL | INTRAMUSCULAR | Status: DC | PRN

## 2011-08-23 MED ORDER — IBUPROFEN 600 MG PO TABS
600.0000 mg | ORAL_TABLET | Freq: Four times a day (QID) | ORAL | Status: DC | PRN
  Administered 2011-08-24: 600 mg via ORAL
  Filled 2011-08-23: qty 1

## 2011-08-23 MED ORDER — LIDOCAINE HCL (CARDIAC) 20 MG/ML IV SOLN
INTRAVENOUS | Status: AC
  Filled 2011-08-23: qty 5

## 2011-08-23 MED ORDER — VASOPRESSIN 20 UNIT/ML IJ SOLN
INTRAMUSCULAR | Status: AC
  Filled 2011-08-23: qty 1

## 2011-08-23 MED ORDER — ONDANSETRON HCL 4 MG/2ML IJ SOLN
INTRAMUSCULAR | Status: AC
  Filled 2011-08-23: qty 2

## 2011-08-23 MED ORDER — NEOSTIGMINE METHYLSULFATE 1 MG/ML IJ SOLN
INTRAMUSCULAR | Status: AC
  Filled 2011-08-23: qty 10

## 2011-08-23 MED ORDER — LACTATED RINGERS IV SOLN
INTRAVENOUS | Status: DC
  Administered 2011-08-23 – 2011-08-24 (×2): via INTRAVENOUS

## 2011-08-23 MED ORDER — VITAMIN D (ERGOCALCIFEROL) 1.25 MG (50000 UNIT) PO CAPS
50000.0000 [IU] | ORAL_CAPSULE | ORAL | Status: DC
  Filled 2011-08-23: qty 1

## 2011-08-23 MED ORDER — MIDAZOLAM HCL 2 MG/2ML IJ SOLN
INTRAMUSCULAR | Status: AC
  Filled 2011-08-23: qty 2

## 2011-08-23 MED ORDER — MIDAZOLAM HCL 5 MG/5ML IJ SOLN
INTRAMUSCULAR | Status: DC | PRN
  Administered 2011-08-23: 2 mg via INTRAVENOUS

## 2011-08-23 MED ORDER — FENTANYL CITRATE 0.05 MG/ML IJ SOLN
INTRAMUSCULAR | Status: AC
Start: 2011-08-23 — End: 2011-08-23
  Filled 2011-08-23: qty 5

## 2011-08-23 MED ORDER — MEPERIDINE HCL 25 MG/ML IJ SOLN: 6.2500 mg | INTRAMUSCULAR | Status: DC | PRN

## 2011-08-23 SURGICAL SUPPLY — 100 items
ADH SKN CLS APL DERMABOND .7 (GAUZE/BANDAGES/DRESSINGS) ×3
APL SKNCLS STERI-STRIP NONHPOA (GAUZE/BANDAGES/DRESSINGS) ×3
BAG SPEC THK2 15X17 2 POUCH (MISCELLANEOUS) ×12
BAG SPECIMEN 15X17 BHZR 2PCKT (MISCELLANEOUS) ×8 IMPLANT
BARRIER ADHS 3X4 INTERCEED (GAUZE/BANDAGES/DRESSINGS) IMPLANT
BENZOIN TINCTURE PRP APPL 2/3 (GAUZE/BANDAGES/DRESSINGS) ×2 IMPLANT
BLADE SURG 10 STRL SS (BLADE) ×2 IMPLANT
BRR ADH 4X3 ABS CNTRL BYND (GAUZE/BANDAGES/DRESSINGS)
CANISTER SUCTION 2500CC (MISCELLANEOUS) ×4 IMPLANT
CATH FOLEY 3WAY  5CC 16FR (CATHETERS)
CATH FOLEY 3WAY 5CC 16FR (CATHETERS) IMPLANT
CHLORAPREP W/TINT 26ML (MISCELLANEOUS) ×4 IMPLANT
CLOTH BEACON ORANGE TIMEOUT ST (SAFETY) ×4 IMPLANT
CONT PATH 16OZ SNAP LID 3702 (MISCELLANEOUS) ×4 IMPLANT
COVER MAYO STAND STRL (DRAPES) ×4 IMPLANT
DECANTER SPIKE VIAL GLASS SM (MISCELLANEOUS) ×4 IMPLANT
DERMABOND ADVANCED (GAUZE/BANDAGES/DRESSINGS) ×1
DERMABOND ADVANCED .7 DNX12 (GAUZE/BANDAGES/DRESSINGS) ×3 IMPLANT
DISSECTOR BLUNT TIP ENDO 5MM (MISCELLANEOUS) IMPLANT
DISSECTOR SPONGE CHERRY (GAUZE/BANDAGES/DRESSINGS) ×2 IMPLANT
DRAIN JACKSON PRT FLT 10 (DRAIN) ×2 IMPLANT
DRESSING TELFA 8X3 (GAUZE/BANDAGES/DRESSINGS) ×2 IMPLANT
DRSG COVADERM 4X10 (GAUZE/BANDAGES/DRESSINGS) ×2 IMPLANT
ELECT REM PT RETURN 9FT ADLT (ELECTROSURGICAL) ×4
ELECTRODE REM PT RTRN 9FT ADLT (ELECTROSURGICAL) ×1 IMPLANT
EVACUATOR SILICONE 100CC (DRAIN) ×2 IMPLANT
EVACUATOR SMOKE 8.L (FILTER) ×8 IMPLANT
FILTER SMOKE EVAC LAPAROSHD (FILTER) ×2 IMPLANT
GAUZE SPONGE 4X4 12PLY STRL LF (GAUZE/BANDAGES/DRESSINGS) ×2 IMPLANT
GAUZE SPONGE 4X4 16PLY XRAY LF (GAUZE/BANDAGES/DRESSINGS) ×6 IMPLANT
GLOVE BIO SURGEON STRL SZ 6.5 (GLOVE) ×8 IMPLANT
GLOVE BIO SURGEON STRL SZ7 (GLOVE) ×2 IMPLANT
GLOVE BIO SURGEON STRL SZ7.5 (GLOVE) ×2 IMPLANT
GLOVE BIOGEL PI IND STRL 6.5 (GLOVE) ×4 IMPLANT
GLOVE BIOGEL PI IND STRL 7.0 (GLOVE) ×4 IMPLANT
GLOVE BIOGEL PI IND STRL 7.5 (GLOVE) ×4 IMPLANT
GLOVE BIOGEL PI INDICATOR 6.5 (GLOVE) ×4
GLOVE BIOGEL PI INDICATOR 7.0 (GLOVE) ×2
GLOVE BIOGEL PI INDICATOR 7.5 (GLOVE) ×2
GLOVE ECLIPSE 6.0 STRL STRAW (GLOVE) ×4 IMPLANT
GLOVE NEODERM STER SZ 7 (GLOVE) ×2 IMPLANT
GLOVE SURG SS PI 7.0 STRL IVOR (GLOVE) ×10 IMPLANT
GOWN PREVENTION PLUS LG XLONG (DISPOSABLE) ×12 IMPLANT
GOWN STRL REIN XL XLG (GOWN DISPOSABLE) ×22 IMPLANT
HEMOSTAT SURGICEL 2X14 (HEMOSTASIS) ×2 IMPLANT
MARKER SKIN DUAL TIP RULER LAB (MISCELLANEOUS) ×2 IMPLANT
NDL HYPO 25X1 1.5 SAFETY (NEEDLE) IMPLANT
NEEDLE HYPO 25X1 1.5 SAFETY (NEEDLE) ×4 IMPLANT
NS IRRIG 1000ML POUR BTL (IV SOLUTION) ×10 IMPLANT
OCCLUDER COLPOPNEUMO (BALLOONS) ×4 IMPLANT
PACK LAPAROSCOPY BASIN (CUSTOM PROCEDURE TRAY) ×4 IMPLANT
PACK LAVH (CUSTOM PROCEDURE TRAY) ×4 IMPLANT
PAD OB MATERNITY 4.3X12.25 (PERSONAL CARE ITEMS) ×6 IMPLANT
PROTECTOR NERVE ULNAR (MISCELLANEOUS) ×6 IMPLANT
SCALPEL HARMONIC ACE (MISCELLANEOUS) ×2 IMPLANT
SCISSORS LAP 5X35 DISP (ENDOMECHANICALS) IMPLANT
SET CYSTO W/LG BORE CLAMP LF (SET/KITS/TRAYS/PACK) ×2 IMPLANT
SET IRRIG TUBING LAPAROSCOPIC (IRRIGATION / IRRIGATOR) ×4 IMPLANT
SLEEVE Z-THREAD 5X100MM (TROCAR) ×4 IMPLANT
SOLUTION ELECTROLUBE (MISCELLANEOUS) ×4 IMPLANT
SPONGE GAUZE 4X4 12PLY (GAUZE/BANDAGES/DRESSINGS) ×2 IMPLANT
SPONGE LAP 18X18 X RAY DECT (DISPOSABLE) ×12 IMPLANT
SPONGE SURGIFOAM ABS GEL 12-7 (HEMOSTASIS) IMPLANT
STAPLER VISISTAT 35W (STAPLE) IMPLANT
STRIP CLOSURE SKIN 1/2X4 (GAUZE/BANDAGES/DRESSINGS) ×2 IMPLANT
STRIP CLOSURE SKIN 1/4X4 (GAUZE/BANDAGES/DRESSINGS) IMPLANT
SUT CHROMIC 0 CT 1 (SUTURE) ×4 IMPLANT
SUT CHROMIC 2 0 SH (SUTURE) ×2 IMPLANT
SUT MNCRL AB 3-0 PS2 27 (SUTURE) ×6 IMPLANT
SUT MON AB 3-0 SH 27 (SUTURE) ×4
SUT MON AB 3-0 SH27 (SUTURE) ×3 IMPLANT
SUT PDS AB 0 CT1 27 (SUTURE) IMPLANT
SUT PDS AB 1 CT1 36 (SUTURE) IMPLANT
SUT PLAIN 2 0 XLH (SUTURE) ×6 IMPLANT
SUT SILK 2 0 FS (SUTURE) ×2 IMPLANT
SUT VIC AB 0 CT1 18XCR BRD8 (SUTURE) ×10 IMPLANT
SUT VIC AB 0 CT1 27 (SUTURE) ×16
SUT VIC AB 0 CT1 27XBRD ANBCTR (SUTURE) ×12 IMPLANT
SUT VIC AB 0 CT1 8-18 (SUTURE) ×16
SUT VIC AB 2-0 SH 27 (SUTURE) ×4
SUT VIC AB 2-0 SH 27XBRD (SUTURE) ×3 IMPLANT
SUT VICRYL 0 TIES 12 18 (SUTURE) ×4 IMPLANT
SUT VICRYL 0 UR6 27IN ABS (SUTURE) ×8 IMPLANT
SYR 50ML LL SCALE MARK (SYRINGE) ×4 IMPLANT
SYR CONTROL 10ML LL (SYRINGE) IMPLANT
SYR TB 1ML LUER SLIP (SYRINGE) ×2 IMPLANT
TIP UTERINE 5.1X6CM LAV DISP (MISCELLANEOUS) IMPLANT
TIP UTERINE 6.7X10CM GRN DISP (MISCELLANEOUS) IMPLANT
TIP UTERINE 6.7X6CM WHT DISP (MISCELLANEOUS) IMPLANT
TIP UTERINE 6.7X8CM BLUE DISP (MISCELLANEOUS) ×2 IMPLANT
TOWEL OR 17X24 6PK STRL BLUE (TOWEL DISPOSABLE) ×12 IMPLANT
TRAY FOLEY CATH 14FR (SET/KITS/TRAYS/PACK) ×4 IMPLANT
TROCAR 5M 150ML BLDLS (TROCAR) ×4 IMPLANT
TROCAR BALLN 12MMX100 BLUNT (TROCAR) ×4 IMPLANT
TROCAR HASSON GELL 12X100 (TROCAR) ×2 IMPLANT
TROCAR Z-THREAD FIOS 11X100 BL (TROCAR) ×8 IMPLANT
TROCAR Z-THREAD FIOS 5X100MM (TROCAR) ×4 IMPLANT
TUBING FILTER THERMOFLATOR (ELECTROSURGICAL) ×4 IMPLANT
WARMER LAPAROSCOPE (MISCELLANEOUS) ×6 IMPLANT
WATER STERILE IRR 1000ML POUR (IV SOLUTION) ×4 IMPLANT

## 2011-08-23 NOTE — Op Note (Addendum)
Procedure(s): HYSTERECTOMY TOTAL LAPAROSCOPIC HYSTERECTOMY ABDOMINAL CYSTOSCOPY Procedure Note  Gabrielle Green female 61 y.o. 08/23/2011  Procedure(s) and Anesthesia Type:    * HYSTERECTOMY TOTAL LAPAROSCOPIC - General    * HYSTERECTOMY ABDOMINAL - General    * CYSTOSCOPY - General  Surgeon(s) and Role:    * Michael Litter, MD - Primary    * Purcell Nails, MD - Assisting    * Purcell Nails, MD - Assisting   Indications: The patient was admitted to the hospital with a brief history of post menopausal bleeding. A US  revealed findings of uterine fibroids and ovarian cysts.. The patient now presents for [facility] after discussing therapeutic alternatives.        Surgeon: Michael Litter   Assistants: Lance Morin MD  Anesthesia: General endotracheal anesthesia  ASA Class:     Procedure Detail  HYSTERECTOMY TOTAL LAPAROSCOPIC, HYSTERECTOMY ABDOMINAL, CYSTOSCOPY  Findings: multiple abdominal and pelvic adhesions.  Peritoneal cysts.  Normal appearing right ovary. Left ovary could not be visualized.  Large uterus with multiple fibroids.   [insert  or specific text here]  Estimated Blood Loss:          Drains: JACKSON-PRATT (JP) in subcutaneous tissue         Total IV Fluids:  Blood Given: none          Specimens: uterus, cervix and portion of left and right fallopian tubes.   Implants: none        Complications:  * No complications entered in OR log *         Disposition: PACU - hemodynamically stable.         Condition: stable Procedure in detail.  The patient was taken to the operating room and placed in dorsal lithotomy position. Prepped and draped in the normal sterile fashion.  Foley catheter was placed into the bladder. A weighted speculum was placed into the posterior fourchette of the vagina. The anterior the cervix was grasped with single-tooth tenaculum.  The cervix was dilated with Pratt dilators up to 21.  The uterus did sound to 9 cm.  The  room he was placed without difficulty. Attention was then turned to the abdomen. A 10 mm infraumbilical incision was made with the scalpel.  This was done after 5 cc of Marcaine mixture was placed in the infraumbilical region.  The incision was taken down to the fascia. Fascia was incised  and extended bilaterally.  Patient was identified and sharply. A pursestring suture was placed around the fascia using 0 Vicryl. A sound was placed into the abdominal cavity without difficulty. Entered placement was confirmed the laparoscope. Two  trochars were placed in the right and left lower quadrants of the abdomen under direct visualization. The patient a large omental abdominal wall adhsion.  5 mm laparoscope was placed in the left low quadrant in the amount scalpel was used to dissect the adhesion of the abdominal wall. No bowel within the omental adhesion. A 10 mm incision and trocar was placed in the left lower quadrant as well. Without difficulty.  During dissection of the abdominal wall adhesion the Roseanne Reno was disrupted the balloon of the bottom of the Hasson was disrupted. The Roseanne Reno was removed and another Roseanne Reno was placed.because of the size of the patient's pannus . A large 5 mm trocar was placed in the right left lower quadrant and replaced the short 5 mm trocar.  A large pedunculated fibroid coming off the fundus of the  uterus the anterior cul-de-sac was seen to be clear the posterior cul-de-sac however was obliterated. At this point decided to do an abdominal procedure.  The 10 mm fascial port was closed with the fascial closure device. 10 mm fascia port at the umbilicus was closed by tying the pursestring suture together. They're still small defect which was closed using 0 Vicryl in a figure-of-eight stitch.  Attention was turned to the abdomen. A Pfannenstiel skin incision was made with the scalpel. This carried down to the fascia using Bovie cautery. Incised in the midline and extended bilaterally using  Mayo scissors. Cokers x2 were placed on the superior aspect of the fascia which was dissected off the rectus muscle both sharply and bluntly. The inferior aspect of the fascia was dissected in a similar fashion. Peritoneum was identified tented up entered sharply. Extended with good visualization of bowel and bladder.  Bowel was packed away with moist laparotomy sponges., O. Connor O'Sullivan retractor was placed into the abdominal cavity. Both round ligaments were clamped cut and ligated with 0 Vicryl. Skin uterine peritoneum was identified tented and sharply and extended bilaterally to create the bladder the bladder flap.  The bladder is for the dissected off of the cervix using sponge on a stick.  The uterine ovarian ligaments were clamps x2 with Kelly's cut and ligated. Stasis was assured. The fibroid on the right side of the uterus which was blocking the uterine arteries. Petrussin was placed along the fibroid.  Serosa was incised with the Bovie cautery and the fibroid was dissected in a blunt and sharp fashion and removed. Shins and right uterine artery was clamped cut and ligated. Hemostasis was assured.  As bowel adherent to the most of the entire posterior wall the uterus.  These adhesions were cut very carefully using Metzenbaum scissors. And no injury to the bowel occurred. She had a fibroid on the posterior left aspect of the uterus. Partrussen mixture  was placed over the fibroid.  The fibroid was removed with blunt and sharp dissection and Bovie cautery which used to remove the serosa over the fibroid. Fundal fibroid was also excised to gain better visualization. Stasis was obtained by using figure-of-eight stitches. Patient's left uterine artery was clamped with Heaney clamps cut and ligated hemostasis was assured. There had  been 2 hours of lysis of adhesions. The patient's was type and crossed for 2 units of blood in case the blood loss was severe. The uterine fundus was then removed with a knife.  In order to obtain better visualization. Again more lysis of adhesions occurred appear.  Sigmoid colon was adherent to the posterior aspect in the posterior cervix of the uterus. These adhesions were removed very meticulously using sharp dissection. The cardinal ligaments and uterosacral ligaments were clamped cut and caught and ligated. The uterus was removed using scissors. The remainder of the vagina was closed using a figure-of-eight 0 Vicryl suture. Irrigation was done and there was some bleeding along the suture area on the patient's left side. This was made hemostatic by tying a figure of 8 suture. Irrigation was done again and hemostasis was assured. Patient's right ovary was adherent to the right sidewall. A small benign cyst which was removed and sent to pathology without difficulty. Difficult to say was a peritoneal cyst or an ovarian cyst. The portion of the tube it could be removed I did clamp cut and ligated sent to pathology.  Was decided not to remove the ovary because it was adherent to the right  sidewall it was difficult to see the infundibulopelvic ligament clearly. The surgery had already been going on for 4 hours. The left ovary could not be seen to visualize were adhesions of the left sigmoid colon to the left sidewall.  I could see a portion of the left fallopian tube which was clamped  cut and ligated.  Irrigation was done and hemostasis was assured.  This was placed along the cuff of the vagina.  Sodium all issues were then removed from the abdomen the peritoneum was closed using 2-0 chromic in a running fashion. All area on the left side of the muscle which had been interactive with the trochars were early on the case.  The peritoneum just underneath the muscle was closed using a figure-of-eight of 2-0 chromic.  Dummies you can see bowel peaking out of the peritoneum.  Bustles hemostatic..  was irrigated and noted to be hemostatic. She was closed with 0 Vicryl in a running fashion from both  sides meeting in the middle. Subcutaneous tissue was irrigated and made hemostatic with Bovie cautery. A 10 JP drain was placed in the subcutaneous tissue.  0 plain was used to reapproximated the subcutaneous tissue.  The 10 mm infraumbilical skin incision and the Pfannenstiel incision was closed with 3-0 Monocryl in a running fashion subarticular fashion. Meinders skin incisions were closed with Dermabond. Cystoscopy was done of the bladder the bladder has normal integrity. Ureters were seen to effluxed indigo carmine without difficulty. A 70 to counts were correct patient to her room in stable condition      Four hours into the surgery.  I discussed the blood loss, abx and status of the pt with anesthesia.  They felt it was ok to proceed

## 2011-08-23 NOTE — Transfer of Care (Signed)
Immediate Anesthesia Transfer of Care Note  Patient: Gabrielle Green  Procedure(s) Performed: Procedure(s) (LRB): HYSTERECTOMY TOTAL LAPAROSCOPIC (N/A) HYSTERECTOMY ABDOMINAL (N/A) CYSTOSCOPY (N/A)  Patient Location: PACU  Anesthesia Type: General  Level of Consciousness: awake, sedated, patient cooperative and responds to stimulation  Airway & Oxygen Therapy: Patient Spontanous Breathing  Post-op Assessment: Post -op Vital signs reviewed and stable  Post vital signs: Reviewed and stable  Complications: No apparent anesthesia complications

## 2011-08-23 NOTE — Anesthesia Procedure Notes (Signed)
Procedure Name: Intubation Date/Time: 08/23/2011 9:58 AM Performed by: Isabella Bowens R Pre-anesthesia Checklist: Patient identified, Patient being monitored, Emergency Drugs available, Timeout performed and Suction available Patient Re-evaluated:Patient Re-evaluated prior to inductionOxygen Delivery Method: Circle system utilized Preoxygenation: Pre-oxygenation with 100% oxygen Intubation Type: IV induction Ventilation: Mask ventilation without difficulty Laryngoscope Size: Mac and 3 Grade View: Grade III Tube type: Oral Tube size: 7.0 mm Number of attempts: 1 Airway Equipment and Method: Stylet Placement Confirmation: ETT inserted through vocal cords under direct vision,  positive ETCO2 and breath sounds checked- equal and bilateral Secured at: 21 cm Tube secured with: Tape Dental Injury: Teeth and Oropharynx as per pre-operative assessment  Difficulty Due To: Difficulty was anticipated

## 2011-08-23 NOTE — Anesthesia Preprocedure Evaluation (Signed)
Anesthesia Evaluation    History of Anesthesia Complications (+) PONV  Airway Mallampati: III TM Distance: >3 FB Neck ROM: Full    Dental No notable dental hx. (+) Teeth Intact   Pulmonary neg pulmonary ROS,  breath sounds clear to auscultation  Pulmonary exam normal       Cardiovascular hypertension, Pt. on medications Rate:Normal     Neuro/Psych negative neurological ROS  negative psych ROS   GI/Hepatic Neg liver ROS, GERD-  Medicated and Controlled,  Endo/Other  Diabetes mellitus-, Well Controlled, Type 2, Oral Hypoglycemic AgentsMorbid obesity  Renal/GU negative Renal ROS  negative genitourinary   Musculoskeletal negative musculoskeletal ROS (+)   Abdominal Normal abdominal exam  (+) + obese,   Peds  Hematology negative hematology ROS (+)   Anesthesia Other Findings   Reproductive/Obstetrics negative OB ROS                           Anesthesia Physical Anesthesia Plan  ASA: III  Anesthesia Plan: General   Post-op Pain Management:    Induction: Intravenous and Cricoid pressure planned  Airway Management Planned: Oral ETT  Additional Equipment:   Intra-op Plan:   Post-operative Plan: Extubation in OR  Informed Consent: I have reviewed the patients History and Physical, chart, labs and discussed the procedure including the risks, benefits and alternatives for the proposed anesthesia with the patient or authorized representative who has indicated his/her understanding and acceptance.   Dental advisory given  Plan Discussed with: CRNA, Anesthesiologist and Surgeon  Anesthesia Plan Comments:         Anesthesia Quick Evaluation
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
## 2011-08-23 NOTE — Anesthesia Postprocedure Evaluation (Signed)
Anesthesia Post Note  Patient: Gabrielle Green  Procedure(s) Performed: Procedure(s) (LRB): HYSTERECTOMY TOTAL LAPAROSCOPIC (N/A) HYSTERECTOMY ABDOMINAL (N/A) CYSTOSCOPY (N/A)  Anesthesia type: General  Patient location: PACU  Post pain: Pain level controlled  Post assessment: Post-op Vital signs reviewed  Last Vitals:  Filed Vitals:   08/23/11 1645  BP: 124/79  Pulse: 81  Temp: 36.6 C  Resp: 16    Post vital signs: Reviewed  Level of consciousness: sedated  Complications: No apparent anesthesia complicationsfj

## 2011-08-23 NOTE — OR Nursing (Signed)
Partial Bilateral Salpingectomy performed by Dr. Normand Sloop in OR 4.

## 2011-08-23 NOTE — Interval H&P Note (Signed)
History and Physical Interval Note:  08/23/2011 9:08 AM  Gabrielle Green  has presented today for surgery, with the diagnosis of Menorrhagia; Ovarian Cyst, Endometrial Polyp  The various methods of treatment have been discussed with the patient and family. After consideration of risks, benefits and other options for treatment, the patient has consented to  Procedure(s) (LRB): HYSTERECTOMY TOTAL LAPAROSCOPIC (N/A) SALPINGO OOPHERECTOMY (Bilateral) HYSTERECTOMY ABDOMINAL (N/A) as a surgical intervention .  The patient's history has been reviewed, patient examined, no change in status, stable for surgery.  I have reviewed the patients' chart and labs.  Questions were answered to the patient's satisfaction.     [id number],JYNWG A  Date of Initial H&P: 08/07/11  History reviewed, patient examined, no change in status, stable for surgery.

## 2011-08-24 ENCOUNTER — Encounter (HOSPITAL_COMMUNITY): Payer: Self-pay | Admitting: Obstetrics and Gynecology

## 2011-08-24 LAB — CBC
MCH: 27.5 pg (ref 26.0–34.0)
MCV: 83 fL (ref 78.0–100.0)
Platelets: 236 10*3/uL (ref 150–400)
RBC: 3.53 MIL/uL — ABNORMAL LOW (ref 3.87–5.11)

## 2011-08-24 LAB — GLUCOSE, CAPILLARY
Glucose-Capillary: 130 mg/dL — ABNORMAL HIGH (ref 70–99)
Glucose-Capillary: 153 mg/dL — ABNORMAL HIGH (ref 70–99)

## 2011-08-24 LAB — BASIC METABOLIC PANEL
CO2: 30 mEq/L (ref 19–32)
Calcium: 8.1 mg/dL — ABNORMAL LOW (ref 8.4–10.5)
Creatinine, Ser: 0.7 mg/dL (ref 0.50–1.10)

## 2011-08-24 NOTE — Anesthesia Postprocedure Evaluation (Signed)
  Anesthesia Post-op Note  Patient: Gabrielle Green  Procedure(s) Performed: Procedure(s) (LRB): HYSTERECTOMY TOTAL LAPAROSCOPIC (N/A) HYSTERECTOMY ABDOMINAL (N/A) CYSTOSCOPY (N/A)  Patient Location: [facility]  Anesthesia Type: General  Level of Consciousness: awake, alert  and oriented  Airway and Oxygen Therapy: Patient Spontanous Breathing  Post-op Pain: mild  Post-op Assessment: Patient's Cardiovascular Status Stable, Respiratory Function Stable, Patent Airway, No signs of Nausea or vomiting, Adequate PO intake and Pain level controlled  Post-op Vital Signs: stable  Complications: No apparent anesthesia complications

## 2011-08-24 NOTE — Progress Notes (Signed)
1 Day Post-Op Procedure(s) (LRB): HYSTERECTOMY TOTAL LAPAROSCOPIC (N/A) HYSTERECTOMY ABDOMINAL (N/A) CYSTOSCOPY (N/A)  Subjective: Patient reports incisional pain and tolerating PO.    Objective: I have reviewed patient's vital signs, intake and output and labs.  General: alert Resp: clear to auscultation bilaterally Cardio: regular rate and rhythm GI: soft, non-tender; bowel sounds normal; no masses,  no organomegaly Extremities: extremities normal, atraumatic, no cyanosis or edema Vaginal Bleeding: minimal  Assessment: s/p Procedure(s) (LRB): HYSTERECTOMY TOTAL LAPAROSCOPIC (N/A) HYSTERECTOMY ABDOMINAL (N/A) CYSTOSCOPY (N/A): stable, progressing well and tolerating diet  Plan: Encourage ambulation Advance to PO medication d/c foley and PCA and heplock IVF.  pts blood sugars are skightly elevated.  I discusseed with the pt why I did not remove her ovaries.  she voiced a clear understanding.  pt also has what looks like a PDS suture coming from a bx area on her right brest.  there is no abscess or soreness.  I will trim the suture and send pt for a mammogram for follow up  LOS: 1 day    Gabrielle Green A 08/24/2011, 3:18 PM

## 2011-08-24 NOTE — Addendum Note (Signed)
Addendum  created 08/24/11 1004 by Lincoln Brigham, CRNA   Modules edited:Notes Section

## 2011-08-24 NOTE — Progress Notes (Signed)
UR Chart review completed.  

## 2011-08-25 LAB — GLUCOSE, CAPILLARY

## 2011-08-25 MED ORDER — IBUPROFEN 600 MG PO TABS: 600.0000 mg | ORAL_TABLET | Freq: Four times a day (QID) | ORAL | Status: AC | PRN

## 2011-08-25 MED ORDER — HYDROCODONE-ACETAMINOPHEN 5-325 MG PO TABS: 1.0000 | ORAL_TABLET | ORAL | Status: AC | PRN

## 2011-08-25 MED ORDER — POTASSIUM CHLORIDE ER 10 MEQ PO TBCR: 20.0000 meq | EXTENDED_RELEASE_TABLET | Freq: Every morning | ORAL | Status: DC

## 2011-08-25 MED ORDER — PROMETHAZINE HCL 12.5 MG PO TABS: 12.5000 mg | ORAL_TABLET | Freq: Four times a day (QID) | ORAL | Status: DC | PRN

## 2011-08-25 NOTE — Progress Notes (Signed)
Pt is discharged in the care of friend. Downstairs per ambulatory. Stab;e. Spirits good; Abd incision is cleanand dry. JP drain is in place and charged. Pt was given instruction on emptying and recharging drain. Understood all discharge instructions well. Questions were asked and answered.Denies excessive vaginal bleeding.

## 2011-08-25 NOTE — Discharge Instructions (Signed)
Hysterectomy  Care After  These instructions give you information on caring for yourself after your procedure. Your doctor may also give you more specific instructions. Call your doctor if you have any problems or questions after your procedure.  HOME CARE  Healing takes time. You may have discomfort, tenderness, puffiness (swelling), and bruising at the wound site. This may last for 2 weeks. This is normal and will get better.   Only take medicine as told by your doctor.   Do not take aspirin.   Do not drive when taking pain medicine.   Exercise, lift objects, drive, and get back to daily activites as told by your doctor.   Get back to your normal diet and activities as told by your doctor.   Get plenty of rest and sleep.   Do not douche, use tampons, or have sex (intercourse) for at least 6 weeks or as told.   Change your bandages (dressings) as told by your doctor.   Take your temperature during the day.   Take showers for 2 to 3 weeks. Do not take baths.   Do not drink alcohol until your doctor says it is okay.   Take a medicine to help you poop (laxative) as told by your doctor. Try eating bran foods. Drink enough fluids to keep your pee (urine) clear or pale yellow.   Have someone help you at home for 1 to 2 weeks after your surgery.   Keep follow-up doctor visits as told.  GET HELP RIGHT AWAY IF:    You have a fever.   You have bad belly (abdominal) pain.   You have chest pain.   You are short of breath.   You pass out (faint).   You have pain, puffiness, or redness of your leg.   You bleed a lot from your vagina and notice clumps of tissue (clots).   You have puffiness, redness, or pain where a tube was put in your vein (IV) or in the wound area.   You have yellowish-white fluid (pus) coming from the wound.   You have a bad smell coming from the wound or bandage.   Your wound pulls apart.   You feel dizzy or lightheaded.   You have pain or bleeding when you pee.   You keep having  watery poop (diarrhea).   You keep feeling sick to your stomach (nauseous) or keep throwing up (vomiting).   You have fluid (discharge) coming from your vagina.   You have a rash.   You have a reaction to your medicine.   You need stronger pain medicine.  MAKE SURE YOU:   Understand these instructions.   Will watch your condition.   Will get help right away if you are not doing well or get worse.  Document Released: 11/16/2007 Document Revised: 01/26/2011 Document Reviewed: 09/23/2010  ExitCare Patient Information 2012 [employer].

## 2011-08-26 LAB — TYPE AND SCREEN
ABO/RH(D): O POS
Unit division: 0

## 2011-08-26 NOTE — Discharge Summary (Signed)
Physician Discharge Summary  Patient ID: Gabrielle Green MRN: [id number] DOB/AGE: 06/19/1950 61 y.o.  Admit date: 08/23/2011 Discharge date: 08/26/2011  Admission Diagnoses: PMVB and symptomatic Fibroids  Discharge Diagnoses: s/p dx laparoscopy, TAH, LOA and cystoscopy  Discharged Condition: stable  Hospital Course: pt underwent Dx L/S, TAh, LOA and cystoscopy.  POD #1 she ambulated and tolerated PO.  She also voided spontaneously once her foley was removed.  POD#2  She states she is ready to go home.  She still tolerates PO and pain is well controlled.    Consults: none  Significant Diagnostic Studies: labs: none  Treatments: IV hydration and surgery:  Discharge Exam: Blood pressure 122/77, pulse 96, temperature 98.1 F (36.7 C), temperature source Oral, resp. rate 16, height 5\' 5"  (1.651 m), weight 284 lb (128.822 kg), SpO2 98.00%. General appearance: alert Head: Normocephalic, without obvious abnormality Neck: no adenopathy, no carotid bruit, no JVD, supple, symmetrical, trachea midline and thyroid not enlarged, symmetric, no tenderness/mass/nodules Resp: clear to auscultation bilaterally Cardio: regular rate and rhythm, S1, S2 normal, no murmur, click, rub or gallop GI: soft, non-tender; bowel sounds normal; no masses,  no organomegaly Pelvic: sant Vb Extremities: extremities normal, atraumatic, no cyanosis or edema Skin: Skin color, texture, turgor normal. No rashes or lesions Incision/Wound: incision CDI with serous drainage in the JP drain  Disposition: stable  Discharge Orders    Future Appointments: Provider: Department: Dept Phone: Center:   08/30/2011 11:15 AM Michael Litter, MD [facility] [phone] None   10/03/2011 10:45 AM Michael Litter, MD [facility] [phone] None     Future Orders Please Complete By Expires   Diet general      Discharge instructions      Comments:   Call for vaginal bleeding if you soak greater than pad per hour   Increase  activity slowly      May shower / Bathe      Sexual Activity Restrictions      Comments:   Pelvic rest.  Nothing in the vagina   Call MD for:  temperature >100.4      Call MD for:  severe uncontrolled pain      Call MD for:  persistant dizziness or light-headedness      Call MD for:  persistant nausea and vomiting      Call MD for:  redness, tenderness, or signs of infection (pain, swelling, redness, odor or green/yellow discharge around incision site)      Call MD for:  difficulty breathing, headache or visual disturbances      Call MD for:  hives      Call MD for:  extreme fatigue        Medication List  As of 08/26/2011 10:15 PM   TAKE these medications         aspirin 81 MG tablet   Take 81 mg by mouth daily.      ergocalciferol 50000 UNITS capsule   Commonly known as: VITAMIN D2   Take 50,000 Units by mouth once a week.      glyBURIDE 2.5 MG tablet   Commonly known as: DIABETA   Take 2.5 mg by mouth daily with breakfast.      HYDROcodone-acetaminophen 5-325 MG per tablet   Commonly known as: NORCO   Take 1-2 tablets by mouth every 4 (four) hours as needed.      ibuprofen 600 MG tablet   Commonly known as: ADVIL,MOTRIN   Take 600 mg by mouth every 6 (six)  hours as needed. For pain      ibuprofen 600 MG tablet   Commonly known as: ADVIL,MOTRIN   Take 1 tablet (600 mg total) by mouth every 6 (six) hours as needed (mild pain).      metFORMIN 500 MG tablet   Commonly known as: GLUCOPHAGE   Take 500 mg by mouth daily with breakfast.      omeprazole 40 MG capsule   Commonly known as: PRILOSEC   Take 40 mg by mouth daily as needed. For indigestion      potassium chloride 10 MEQ tablet   Commonly known as: K-DUR   Take 2 tablets (20 mEq total) by mouth every morning.      promethazine 12.5 MG tablet   Commonly known as: PHENERGAN   Take 1 tablet (12.5 mg total) by mouth every 6 (six) hours as needed for nausea.      simvastatin 40 MG tablet   Commonly known as:  ZOCOR   Take 40 mg by mouth at bedtime.      triamterene-hydrochlorothiazide 37.5-25 MG per tablet   Commonly known as: MAXZIDE-25   Take 1 each (1 tablet total) by mouth daily.           @FOLLOWUP  1 week for JP drain removal  Signed: [name] A 08/26/2011, 10:15 PM

## 2011-08-30 ENCOUNTER — Ambulatory Visit (INDEPENDENT_AMBULATORY_CARE_PROVIDER_SITE_OTHER): Payer: BC Managed Care – PPO | Admitting: Obstetrics and Gynecology

## 2011-08-30 ENCOUNTER — Encounter: Payer: Self-pay | Admitting: Obstetrics and Gynecology

## 2011-08-30 VITALS — BP 132/78 | HR 96 | Temp 97.9°F | Wt 272.0 lb

## 2011-08-30 DIAGNOSIS — Z90710 Acquired absence of both cervix and uterus: Secondary | ICD-10-CM

## 2011-08-30 NOTE — Progress Notes (Signed)
DATE OF SURGERY: 08/23/11 TYPE OF SURGERY:hysterectomy  PAIN:No VAG BLEEDING: no VAG DISCHARGE: no NORMAL GI FUNCTN: yes NORMAL GU FUNCTN: yes Pt states sometimes she feels constipated, but taking meds for it helps. Pt also states she feels pain when coughing and turning.  Pt here to remove JP drain.  She had a TAH.  She states her blood sugars are variable BP 132/78  Pulse 96  Temp 97.9 F (36.6 C) (Oral)  Wt 272 lb (123.378 kg) Physical Examination: General appearance - alert, well appearing, and in no distress Abdomen - soft, nontender, nondistended, no masses or organomegaly Incision intact but moist secondary to overlying pannus. Jp removed. Incision cleaned and dried.  Wound care reviewed with the pt. RT for post op in 5 weeks or PRN

## 2011-09-01 ENCOUNTER — Telehealth: Payer: Self-pay | Admitting: Obstetrics and Gynecology

## 2011-09-01 NOTE — Telephone Encounter (Signed)
Spoke with pt Gabrielle Green msg pt states urinating blood pt states not bright red but urine had a red tint to it pt also states some pain towards the end of urinating offered pt an appt for today for eval pt declined appt pt wants appt for Monday pt has appt 09/04/11 at 2:00 with AVS advised pt if bleeding gets worse or fever occur or pain increases call ofice immediately for eval pt voice understanding

## 2011-09-04 ENCOUNTER — Encounter: Payer: BC Managed Care – PPO | Admitting: Obstetrics and Gynecology

## 2011-09-11 ENCOUNTER — Encounter: Payer: Self-pay | Admitting: Obstetrics and Gynecology

## 2011-09-11 ENCOUNTER — Ambulatory Visit (INDEPENDENT_AMBULATORY_CARE_PROVIDER_SITE_OTHER): Payer: BC Managed Care – PPO | Admitting: Obstetrics and Gynecology

## 2011-09-11 ENCOUNTER — Telehealth: Payer: Self-pay | Admitting: Obstetrics and Gynecology

## 2011-09-11 VITALS — BP 112/74 | Temp 98.2°F

## 2011-09-11 DIAGNOSIS — IMO0001 Reserved for inherently not codable concepts without codable children: Secondary | ICD-10-CM

## 2011-09-11 DIAGNOSIS — N390 Urinary tract infection, site not specified: Secondary | ICD-10-CM

## 2011-09-11 DIAGNOSIS — R350 Frequency of micturition: Secondary | ICD-10-CM

## 2011-09-11 MED ORDER — NITROFURANTOIN MONOHYD MACRO 100 MG PO CAPS: 100.0000 mg | ORAL_CAPSULE | Freq: Two times a day (BID) | ORAL | Status: AC

## 2011-09-11 NOTE — Telephone Encounter (Signed)
Niccole/nd pt °

## 2011-09-11 NOTE — Progress Notes (Signed)
61 YO S/P Hysterectomy with unilateral oophorectomy  08/23/2011 complains of urinary incontinence 4 days ago.  Before that time saw blood in urine and noticed that it was cloudy. Denies any fever or flank pain.  Patient complains of dull left lower quadrant achiness from time to time.     O: Back: no CVA tenderness      Abdomen: soft, non-tender      Pelvic: EGBUS-atrophic, vagina-mildly atrophic, vaginal cuff intact with mild tenderness, uterus and cervix-surgically absent, adnexae-no masses or       tenderness   U/A= pH 5.0, SG 1.020  leuk-4 +, protein 1+  blood 3+   A: UTI     S/P Hysterectomy/unilateral oophorectomy 08/23/11   P: Macrobid 100  mg  # 14 bid x 7 days       Urine for culture          Increase hydration      RTO-post op exam

## 2011-09-11 NOTE — Telephone Encounter (Signed)
Spoke with Green Gabrielle msg Green states s/p surgery on 08/30/11 soaking pad an hr since Saturday no fever no pain offered Green an appt Green has appt 09/11/11 at 3:30 with ep Green voice understanding

## 2011-09-13 LAB — URINE CULTURE

## 2011-09-14 ENCOUNTER — Other Ambulatory Visit: Payer: Self-pay

## 2011-09-14 ENCOUNTER — Telehealth: Payer: Self-pay

## 2011-09-14 MED ORDER — CIPROFLOXACIN HCL 250 MG PO TABS: ORAL_TABLET | ORAL | Status: DC

## 2011-09-14 NOTE — Telephone Encounter (Signed)
TC TO PT REGARDING MESSAGE. ADVISED PT OF THE BELOW NOTE AND WILL SEND RX TO PT PHARMACY. PT VOICED UNDERSTANDING.

## 2011-09-14 NOTE — Telephone Encounter (Signed)
Message copied by Winfred Leeds on Thu Sep 14, 2011 11:21 AM ------      Message from: Henreitta Leber      Created: Thu Sep 14, 2011  7:09 AM       Patient was given Macrobid for her UTI but the germ she has is resistant to that. Please call her and let her know, then order Cipro 250 mg  #14 1 po bid x 7 days with no refills.  Thanks,  EP

## 2011-10-03 ENCOUNTER — Ambulatory Visit (INDEPENDENT_AMBULATORY_CARE_PROVIDER_SITE_OTHER): Payer: BC Managed Care – PPO | Admitting: Obstetrics and Gynecology

## 2011-10-03 ENCOUNTER — Encounter: Payer: Self-pay | Admitting: Obstetrics and Gynecology

## 2011-10-03 VITALS — BP 112/64 | Temp 97.5°F | Ht 65.0 in | Wt 264.0 lb

## 2011-10-03 DIAGNOSIS — N390 Urinary tract infection, site not specified: Secondary | ICD-10-CM

## 2011-10-03 DIAGNOSIS — Z9889 Other specified postprocedural states: Secondary | ICD-10-CM

## 2011-10-03 LAB — POCT URINALYSIS DIPSTICK
Nitrite, UA: NEGATIVE
Protein, UA: NEGATIVE
Urobilinogen, UA: NEGATIVE
pH, UA: 7

## 2011-10-03 NOTE — Progress Notes (Signed)
DATE OF SURGERY: 08/23/2011 TYPE OF SURGERY: attempted TLH with conversion to [facility] PAIN:No VAG BLEEDING: no VAG DISCHARGE: no NORMAL GI FUNCTN: yes NORMAL GU FUNCTN: yes pt still feels like she is not completely emptying her bladder BP 112/64  Temp 97.5 F (36.4 C)  Ht 5\' 5"  (1.651 m)  Wt 264 lb (119.75 kg)  BMI 43.93 kg/m2 Physical Examination: General appearance - alert, well appearing, and in no distress Heart - normal rate and regular rhythm Abdomen - soft, nontender, nondistended, no masses or organomegaly Pelvic - normal external genitalia, vulva,.  Vicryl suture still at the cuff.  Cuff is healing well Results for orders placed in visit on 10/03/11  POCT URINALYSIS DIPSTICK      Component Value Range   Color, UA       Clarity, UA       Glucose, UA neg     Bilirubin, UA neg     Ketones, UA neg     Spec Grav, UA 1.010     Blood, UA neg     pH, UA 7.0     Protein, UA neg     Urobilinogen, UA negative     Nitrite, UA neg     Leukocytes, UA small (1+)     path benign S/p TAH with delayed emptying of the bladder Check urine culture Pt can RT to routine activities.  No IC for two more weeks Pt can rt to work

## 2012-01-22 ENCOUNTER — Other Ambulatory Visit: Payer: Self-pay | Admitting: Family Medicine

## 2012-04-25 ENCOUNTER — Encounter: Payer: Self-pay | Admitting: Family Medicine

## 2012-04-25 ENCOUNTER — Ambulatory Visit (INDEPENDENT_AMBULATORY_CARE_PROVIDER_SITE_OTHER): Payer: BC Managed Care – PPO | Admitting: Family Medicine

## 2012-04-25 VITALS — BP 138/96 | HR 84 | Temp 98.4°F | Wt 271.0 lb

## 2012-04-25 DIAGNOSIS — R5381 Other malaise: Secondary | ICD-10-CM

## 2012-04-25 DIAGNOSIS — R6889 Other general symptoms and signs: Secondary | ICD-10-CM

## 2012-04-25 DIAGNOSIS — R5383 Other fatigue: Secondary | ICD-10-CM

## 2012-04-25 LAB — CBC WITH DIFFERENTIAL/PLATELET
Basophils Absolute: 0 10*3/uL (ref 0.0–0.1)
Basophils Relative: 0.4 % (ref 0.0–3.0)
Eosinophils Absolute: 0.3 10*3/uL (ref 0.0–0.7)
MCHC: 32.6 g/dL (ref 30.0–36.0)
MCV: 81.8 fl (ref 78.0–100.0)
Monocytes Absolute: 0.7 10*3/uL (ref 0.1–1.0)
Neutrophils Relative %: 49 % (ref 43.0–77.0)
Platelets: 302 10*3/uL (ref 150.0–400.0)
RDW: 14.4 % (ref 11.5–14.6)

## 2012-04-25 LAB — COMPREHENSIVE METABOLIC PANEL
ALT: 21 U/L (ref 0–35)
Alkaline Phosphatase: 86 U/L (ref 39–117)
CO2: 24 mEq/L (ref 19–32)
Creatinine, Ser: 0.9 mg/dL (ref 0.4–1.2)
GFR: 81.96 mL/min (ref >60.00)
Total Bilirubin: 0.5 mg/dL (ref 0.3–1.2)

## 2012-04-25 LAB — T4, FREE: Free T4: 0.88 ng/dL (ref 0.60–1.60)

## 2012-04-25 LAB — TSH: TSH: 0.56 u[IU]/mL (ref 0.35–5.50)

## 2012-04-25 MED ORDER — GLYBURIDE 2.5 MG PO TABS: ORAL_TABLET | ORAL | Status: DC

## 2012-04-25 MED ORDER — NYSTATIN 100000 UNIT/GM EX POWD: Freq: Four times a day (QID) | CUTANEOUS | Status: DC

## 2012-04-25 MED ORDER — TERCONAZOLE 0.4 % VA CREA: 1.0000 | TOPICAL_CREAM | Freq: Every day | VAGINAL | Status: DC

## 2012-04-25 NOTE — Patient Instructions (Addendum)
Monilial Vaginitis Vaginitis in a soreness, swelling and redness (inflammation) of the vagina and vulva. Monilial vaginitis is not a sexually transmitted infection. CAUSES  Yeast vaginitis is caused by yeast (candida) that is normally found in your vagina. With a yeast infection, the candida has overgrown in number to a point that upsets the chemical balance. SYMPTOMS   White, thick vaginal discharge.  Swelling, itching, redness and irritation of the vagina and possibly the lips of the vagina (vulva).  Burning or painful urination.  Painful intercourse. DIAGNOSIS  Things that may contribute to monilial vaginitis are:  Postmenopausal and virginal states.  Pregnancy.  Infections.  Being tired, sick or stressed, especially if you had monilial vaginitis in the past.  Diabetes. Good control will help lower the chance.  Birth control pills.  Tight fitting garments.  Using bubble bath, feminine sprays, douches or deodorant tampons.  Taking certain medications that kill germs (antibiotics).  Sporadic recurrence can occur if you become ill. TREATMENT  Your caregiver will give you medication.  There are several kinds of anti monilial vaginal creams and suppositories specific for monilial vaginitis. For recurrent yeast infections, use a suppository or cream in the vagina 2 times a week, or as directed.  Anti-monilial or steroid cream for the itching or irritation of the vulva may also be used. Get your caregiver's permission.  Painting the vagina with methylene blue solution may help if the monilial cream does not work.  Eating yogurt may help prevent monilial vaginitis. HOME CARE INSTRUCTIONS   Finish all medication as prescribed.  Do not have sex until treatment is completed or after your caregiver tells you it is okay.  Take warm sitz baths.  Do not douche.  Do not use tampons, especially scented ones.  Wear cotton underwear.  Avoid tight pants and panty  hose.  Tell your sexual partner that you have a yeast infection. They should go to their caregiver if they have symptoms such as mild rash or itching.  Your sexual partner should be treated as well if your infection is difficult to eliminate.  Practice safer sex. Use condoms.  Some vaginal medications cause latex condoms to fail. Vaginal medications that harm condoms are:  Cleocin cream.  Butoconazole (Femstat).  Terconazole (Terazol) vaginal suppository.  Miconazole (Monistat) (may be purchased over the counter). SEEK MEDICAL CARE IF:   You have a temperature by mouth above 102 F (38.9 C).  The infection is getting worse after 2 days of treatment.  The infection is not getting better after 3 days of treatment.  You develop blisters in or around your vagina.  You develop vaginal bleeding, and it is not your menstrual period.  You have pain when you urinate.  You develop intestinal problems.  You have pain with sexual intercourse. Document Released: 11/16/2004 Document Revised: 05/01/2011 Document Reviewed: 07/31/2008 Exit[employer] Patient Information 2013 [employer].  

## 2012-04-25 NOTE — Progress Notes (Signed)
Subjective:    Patient ID: Gabrielle Green, female    DOB: [birth year], 62 y.o.   MRN: [id number]  HPI  62 yo with h/o DM, HTN here for:  1.  Thick vaginal discharge.   Itchy with foul odor x 1 week. S/p TAHBSO is 08/2011.  No pelvic pain.  2.  White, irritated rash in suprapubic abdominal fold.  Has been there for weeks.  OTC "antifungal" cream not working.  3.  Cold intolerance x 2 months with fatigue. Has been constipated as well.  Denies any other symptoms of hypo or hyperthyroidism. Colonoscopy UTD.    Patient Active Problem List  Diagnosis  . STREPTOCOCCAL PHARYNGITIS  . Type II or unspecified type diabetes mellitus without mention of complication, not stated as uncontrolled  . HYPERLIPIDEMIA  . HEARING LOSS, MILD, BILATERAL  . ESSENTIAL HYPERTENSION, BENIGN  . HYPERTENSION  . ACUTE PHARYNGITIS  . PERIODONTAL DISEASE  . BACK PAIN, THORACIC REGION, CHRONIC  . HYPOKALEMIA, HX OF  . Obesity  . GERD (gastroesophageal reflux disease)  . Skin lesion  . Leg mass  . Menopausal bleeding  . Endometrial polyp  . Ovarian cyst  . Leg mass, right  . Hypertension  . Vaginal bleeding  . Cold intolerance  . Vaginitis and vulvovaginitis  . Intertriginous candidiasis   Past Medical History  Diagnosis Date  . Hyperlipidemia   . Hypertension   . Obesity   . Leg mass, right   . Cough   . Vaginal bleeding   . PONV (postoperative nausea and vomiting)   . Arthritis   . Diabetes mellitus     on metformin qam-pt states was ordered bid-takes only once day. glyburide qam. pt states checks fasting occasionally, was 220 several days ago.  Marland Kitchen GERD (gastroesophageal reflux disease)     omeprazole prn  . Yeast infection   . H/O scoliosis   . Leaking of urine   . Varicose veins   . Hx of varicella   . H/O measles   . Bacterial infection   . H/O vaginal discharge 2001  . H/O dysmenorrhea 2002  . H/O menorrhagia 2002  . H/O uterine prolapse 2006  . PMB (postmenopausal bleeding)  04/13/2011   Past Surgical History  Procedure Laterality Date  . Tumor removal  1989    from ovary  . Wisdom tooth extraction    . Myomectomy abdominal approach  1977  . Biopsy breast    . Laparoscopic hysterectomy  08/23/2011    Procedure: HYSTERECTOMY TOTAL LAPAROSCOPIC;  Surgeon: Michael Litter, MD;  Location: [facility];  Service: Gynecology;  Laterality: N/A;  . Abdominal hysterectomy  08/23/2011    Procedure: HYSTERECTOMY ABDOMINAL;  Surgeon: Michael Litter, MD;  Location: [facility];  Service: Gynecology;  Laterality: N/A;  start time of 1154.  See OR note  . Cystoscopy  08/23/2011    Procedure: CYSTOSCOPY;  Surgeon: Michael Litter, MD;  Location: [facility];  Service: Gynecology;  Laterality: N/A;  . Dilation and curettage of uterus    . Breast reduction surgery    . Fibroid tumor removed     History  Substance Use Topics  . Smoking status: Never Smoker   . Smokeless tobacco: Never Used  . Alcohol Use: No   Family History  Problem Relation Age of Onset  . Diabetes Mother   . Hypertension Mother   . Cancer Mother     breast  . Stroke Mother   . Heart disease Mother   .  Hypertension Sister   . Diabetes Sister   . Diabetes Brother   . Kidney disease Brother    Allergies  Allergen Reactions  . Penicillins Nausea And Vomiting and Cough   Current Outpatient Prescriptions on File Prior to Visit  Medication Sig Dispense Refill  . aspirin 81 MG tablet Take 81 mg by mouth daily.        Marland Kitchen ibuprofen (ADVIL,MOTRIN) 600 MG tablet Take 600 mg by mouth every 6 (six) hours as needed. For pain      . metFORMIN (GLUCOPHAGE) 500 MG tablet Take 500 mg by mouth daily with breakfast.      . omeprazole (PRILOSEC) 40 MG capsule Take 40 mg by mouth daily as needed. For indigestion      . simvastatin (ZOCOR) 40 MG tablet Take 40 mg by mouth at bedtime.      . triamterene-hydrochlorothiazide (MAXZIDE-25) 37.5-25 MG per tablet Take 1 each (1 tablet total) by mouth daily.  90 tablet  3   No current  facility-administered medications on file prior to visit.   The PMH, PSH, Social History, Family History, Medications, and allergies have been reviewed in [facility], and have been updated if relevant.   Review of Systems    See HPI Objective:   Physical Exam BP 138/96  Pulse 84  Temp(Src) 98.4 F (36.9 C)  Wt 271 lb (122.925 kg)  BMI 45.1 kg/m2  General:  Well-developed,well-nourished,in no acute distress; alert,appropriate and cooperative throughout examination Head:  normocephalic and atraumatic.   Eyes:  vision grossly intact, pupils equal, pupils round, and pupils reactive to light.   Ears:  R ear normal and L ear normal.   Nose:  no external deformity.   Mouth:  good dentition.   Resp:  CTA bilaterally CVS: RRR Skin:  Irritated, erythematous crease in intetriginous abdominal fold Neurologic:  alert & oriented X3 and gait normal.   GU:  Pos thick, white discharge in vault, Wet prep positive for yeast. Psych:  Cognition and judgment appear intact. Alert and cooperative with normal attention span and concentration. No apparent delusions, illusions, hallucinations      Assessment & Plan:  1. Cold intolerance New- will check labs today to start work up. - CBC with Differential - TSH - T4, Free - Comprehensive metabolic panel  2. Vaginitis and vulvovaginitis Wet prep positive for yeast. Treat with Terazol x 7 days.  3. Intertriginous candidiasis New- topical nystatin powder tid- qid until rash resolves.  4. Other malaise and fatigue See #1 - CBC with Differential - TSH - T4, Free - Comprehensive metabolic panel - Vitamin D, 25-hydroxy - Vitamin B12

## 2012-04-26 LAB — VITAMIN D 25 HYDROXY (VIT D DEFICIENCY, FRACTURES): Vit D, 25-Hydroxy: 20 ng/mL — ABNORMAL LOW (ref 30–89)

## 2012-04-30 MED ORDER — ERGOCALCIFEROL 1.25 MG (50000 UT) PO CAPS: ORAL_CAPSULE | ORAL | Status: AC

## 2012-04-30 NOTE — Addendum Note (Signed)
Addended by: Eliezer Bottom on: 04/30/2012 02:17 PM   Modules accepted: Orders

## 2012-05-13 ENCOUNTER — Encounter: Payer: Self-pay | Admitting: Family Medicine

## 2012-06-18 ENCOUNTER — Other Ambulatory Visit: Payer: Self-pay | Admitting: Family Medicine

## 2012-07-11 ENCOUNTER — Other Ambulatory Visit (INDEPENDENT_AMBULATORY_CARE_PROVIDER_SITE_OTHER): Payer: BC Managed Care – PPO

## 2012-07-11 DIAGNOSIS — E550 Rickets, active: Secondary | ICD-10-CM

## 2012-07-12 LAB — VITAMIN D 25 HYDROXY (VIT D DEFICIENCY, FRACTURES): Vit D, 25-Hydroxy: 41 ng/mL (ref 30–89)

## 2012-07-18 ENCOUNTER — Encounter: Payer: Self-pay | Admitting: *Deleted

## 2012-07-30 ENCOUNTER — Other Ambulatory Visit: Payer: Self-pay | Admitting: *Deleted

## 2012-07-30 NOTE — Telephone Encounter (Signed)
Please call pt to verify how she is taking this. 

## 2012-07-30 NOTE — Telephone Encounter (Signed)
Left message at work number asking patient to call back.

## 2012-07-30 NOTE — Telephone Encounter (Signed)
Faxed refill request from [facility] for metformin 500 mg's.  Request is for one twice a day, but chart has that she takes this once a day.  Please advise.

## 2012-07-31 NOTE — Telephone Encounter (Signed)
Left message on home number asking patient to call back.

## 2012-08-01 MED ORDER — METFORMIN HCL 500 MG PO TABS: 500.0000 mg | ORAL_TABLET | Freq: Two times a day (BID) | ORAL | Status: DC

## 2012-08-01 NOTE — Telephone Encounter (Signed)
Spoke with patient, she says she takes one tablet twice a day.  Refills sent to pharmacy. Directions changed on med list.

## 2012-12-16 ENCOUNTER — Other Ambulatory Visit: Payer: Self-pay | Admitting: *Deleted

## 2012-12-16 MED ORDER — TRIAMTERENE-HCTZ 37.5-25 MG PO TABS: ORAL_TABLET | ORAL | Status: AC

## 2013-02-10 ENCOUNTER — Other Ambulatory Visit: Payer: Self-pay | Admitting: *Deleted

## 2013-02-10 MED ORDER — GLYBURIDE 2.5 MG PO TABS: ORAL_TABLET | ORAL | Status: DC

## 2013-05-26 ENCOUNTER — Other Ambulatory Visit: Payer: Self-pay | Admitting: *Deleted

## 2013-05-26 MED ORDER — GLYBURIDE 2.5 MG PO TABS: ORAL_TABLET | ORAL | Status: DC

## 2013-05-26 NOTE — Telephone Encounter (Signed)
Faxed refill request. Last OV 05/05/12.  Last filled:  90 tablets 0RF on 02/10/2013.  Patient has not been seen in greater than 1 year with no upcoming appt scheduled.  According to refill protocol, please advise.

## 2013-10-31 ENCOUNTER — Ambulatory Visit: Payer: Self-pay | Admitting: Internal Medicine

## 2014-01-23 ENCOUNTER — Ambulatory Visit: Payer: Self-pay | Admitting: Gastroenterology

## 2014-06-15 LAB — SURGICAL PATHOLOGY

## 2014-10-12 DIAGNOSIS — E1165 Type 2 diabetes mellitus with hyperglycemia: Secondary | ICD-10-CM | POA: Insufficient documentation

## 2015-06-11 IMAGING — US ABDOMEN ULTRASOUND LIMITED
1 series · 14 of 25 positions shown · non-contrast
Comparison: None.

CLINICAL DATA: Right upper quadrant abdominal pain for 1 week.

EXAM:
US ABDOMEN LIMITED - RIGHT UPPER QUADRANT

[Series 1: abdomen ultrasound limited · 0.24mm/px · 14 of 56 slices shown]
[im 1/56]
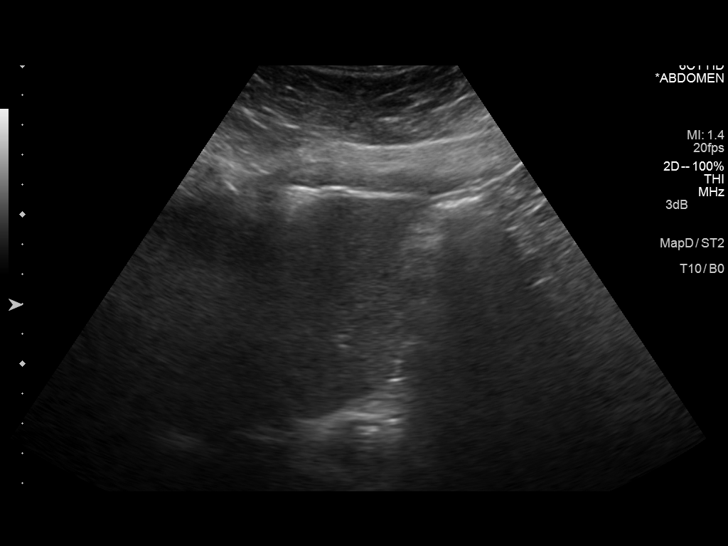
[im 5/56]
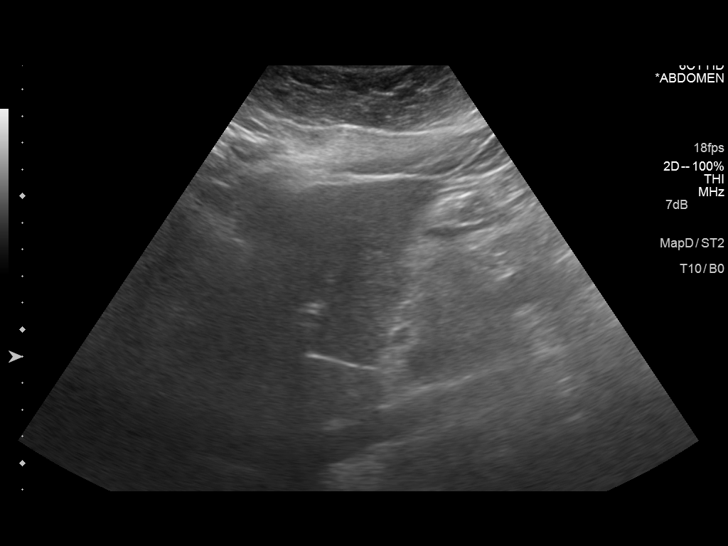
[im 10/56]
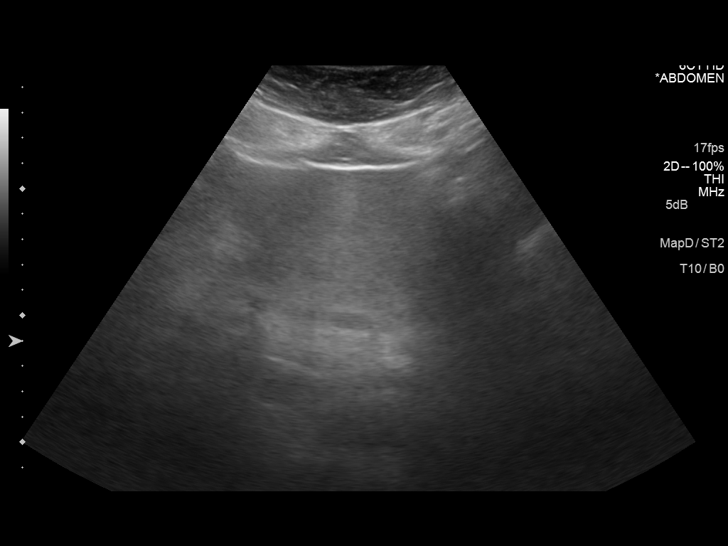
[im 14/56]
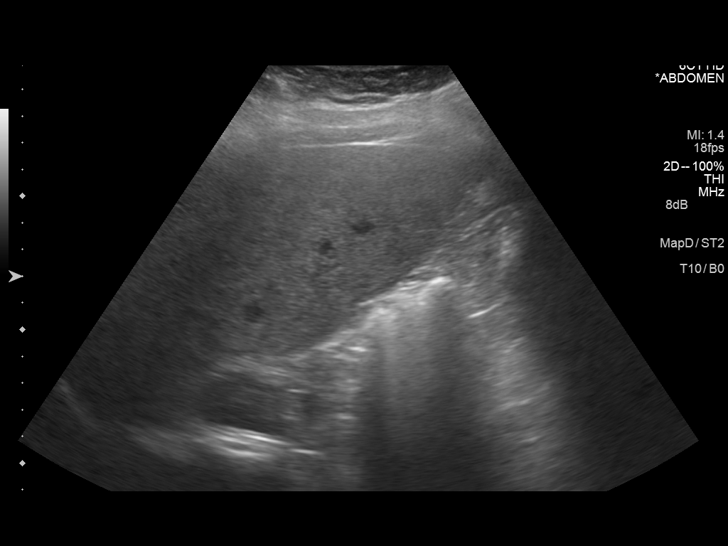
[im 19/56]
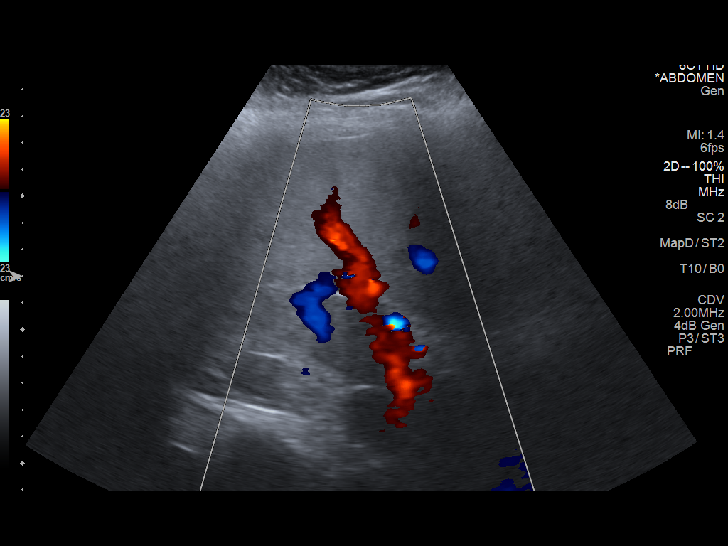
[im 21/56]
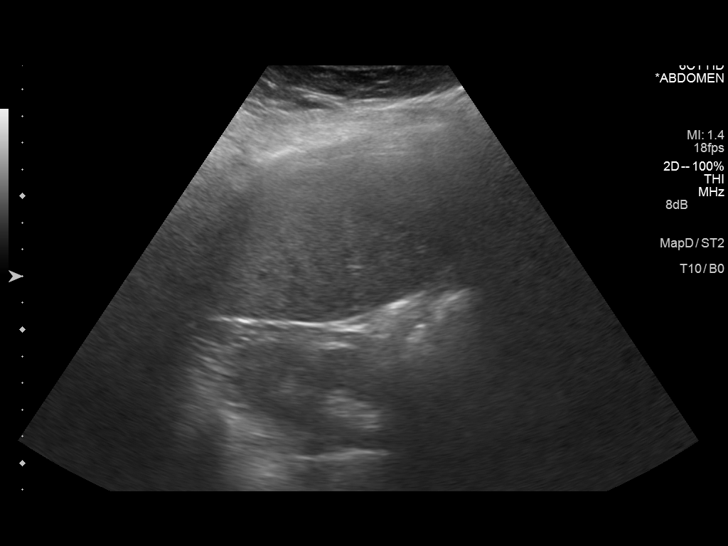
[im 26/56]
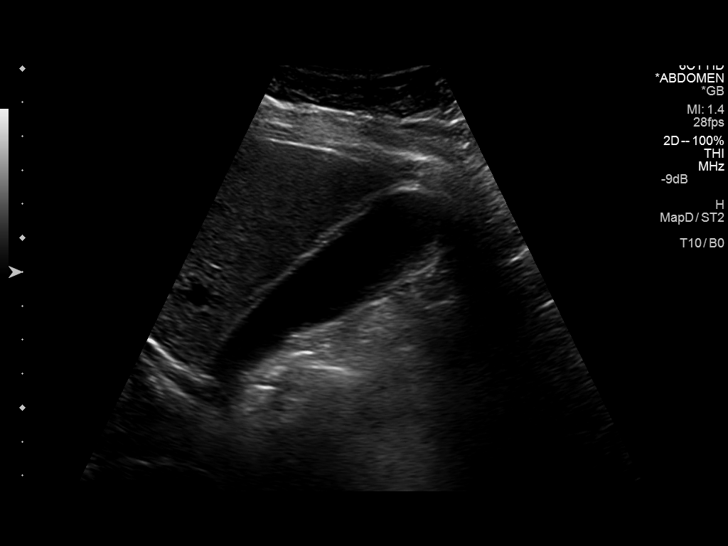
[im 30/56]
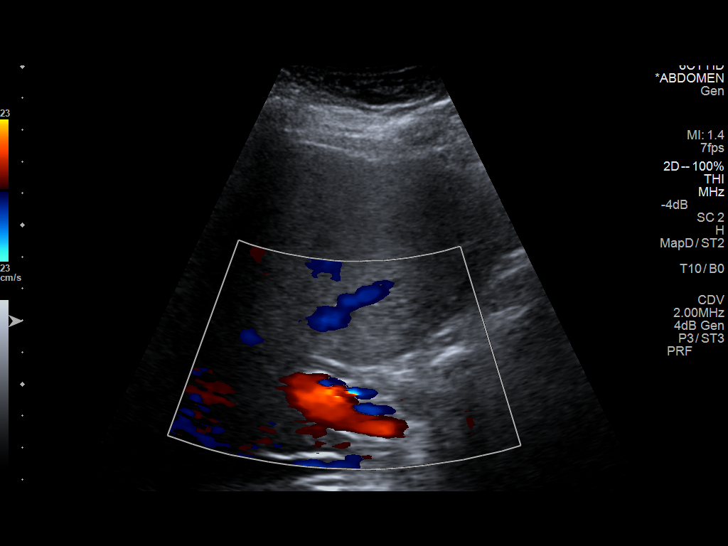
[im 35/56]
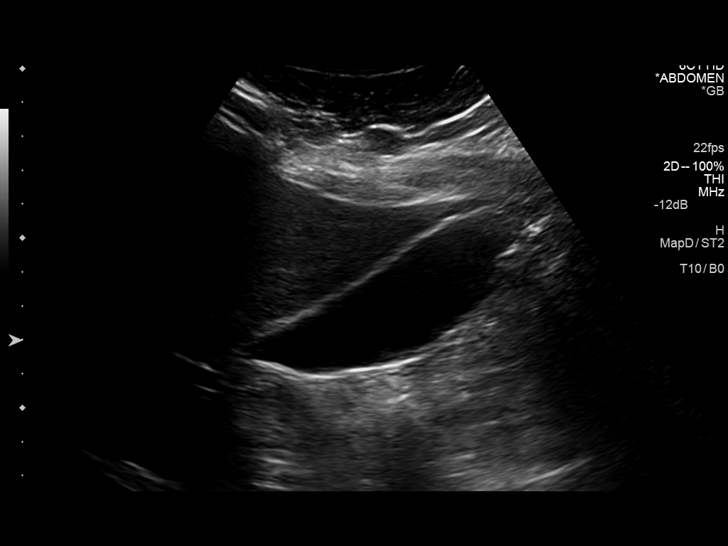
[im 37/56]
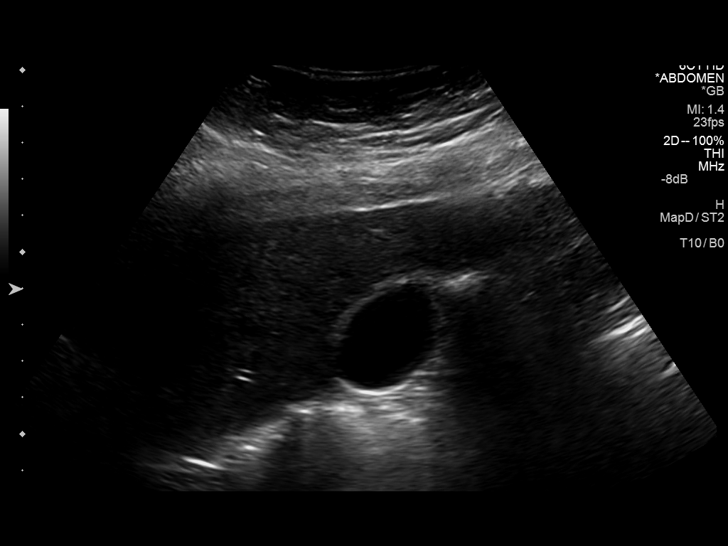
[im 42/56]
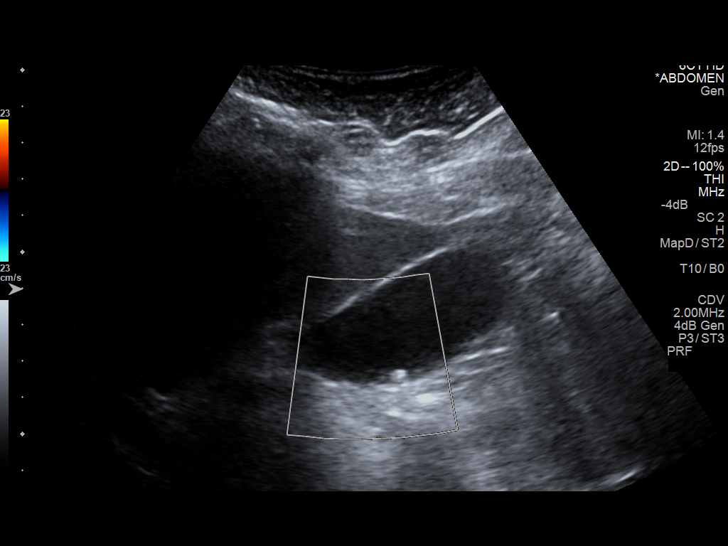
[im 46/56]
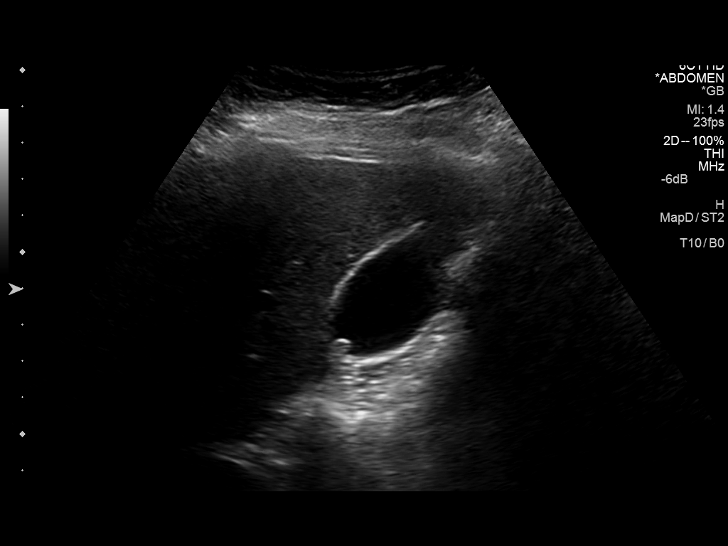
[im 51/56]
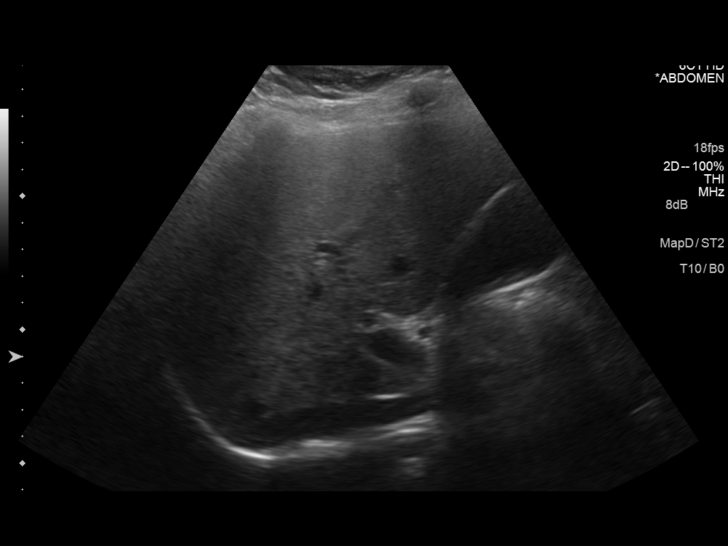
[im 56/56]
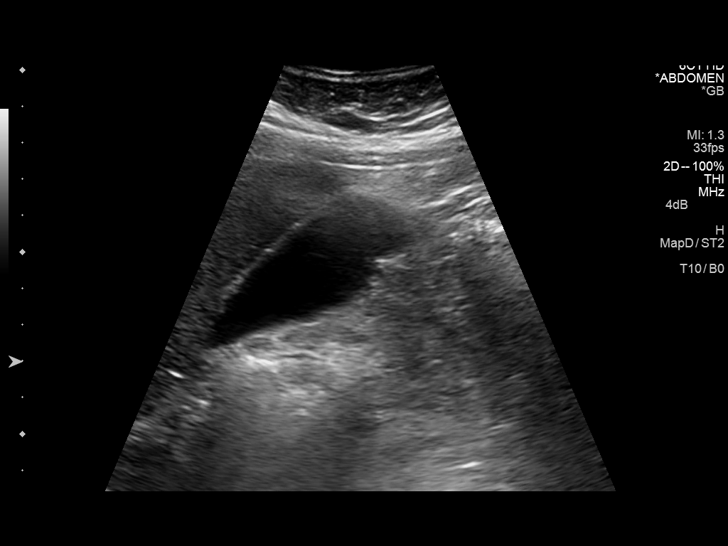

[14 of 25 positions shown; findings below may reference images not displayed]

FINDINGS: Gallbladder:

No gallbladder wall thickening. Sonographic Murphy's sign absent.
There is a small mild bowel echogenic focus measuring 0.6 cm with
equivocal shadowing, favor gallstone upper tumefactive sludge.

Common bile duct:

Diameter: 3 mm

Liver:

No focal lesion identified. Within normal limits in parenchymal
echogenicity.
IMPRESSION: 1. 6 mm gallstone in the gallbladder. No pericholecystic fluid,
gallbladder wall thickening, or sonographic Murphy sign.

## 2016-01-25 ENCOUNTER — Ambulatory Visit: Payer: BLUE CROSS/BLUE SHIELD | Attending: Otolaryngology | Admitting: Speech Pathology

## 2016-01-25 ENCOUNTER — Encounter: Payer: Self-pay | Admitting: Speech Pathology

## 2016-01-25 DIAGNOSIS — R490 Dysphonia: Secondary | ICD-10-CM

## 2016-01-25 NOTE — Therapy (Signed)
[place] [facility] MAIN [facility] SERVICES [address] [place], [place], [address] Phone: [phone]   Fax:  [phone]  Speech Language Pathology Evaluation  Patient Details  Name: Gabrielle Green MRN: [id number] Date of Birth: 12/19/1950 Referring Provider: Dr. Andee Poles  Encounter Date: 01/25/2016      End of Session - 01/25/16 1633    Visit Number 1   Number of Visits 17   Date for SLP Re-Evaluation 03/27/16   SLP Start Time 1400   SLP Stop Time  1455   SLP Time Calculation (min) 55 min   Activity Tolerance Patient tolerated treatment well      Past Medical History:  Diagnosis Date  . Arthritis   . Bacterial infection   . Cough   . Diabetes mellitus    on metformin qam-pt states was ordered bid-takes only once day. glyburide qam. pt states checks fasting occasionally, was 220 several days ago.  Marland Kitchen GERD (gastroesophageal reflux disease)    omeprazole prn  . H/O dysmenorrhea 2002  . H/O measles   . H/O menorrhagia 2002  . H/O scoliosis   . H/O uterine prolapse 2006  . H/O vaginal discharge 2001  . Hx of varicella   . Hyperlipidemia   . Hypertension   . Leaking of urine   . Leg mass, right   . Obesity   . PMB (postmenopausal bleeding) 04/13/2011  . PONV (postoperative nausea and vomiting)   . Vaginal bleeding   . Varicose veins   . Yeast infection     Past Surgical History:  Procedure Laterality Date  . ABDOMINAL HYSTERECTOMY  08/23/2011   Procedure: HYSTERECTOMY ABDOMINAL;  Surgeon: Michael Litter, MD;  Location: [facility];  Service: Gynecology;  Laterality: N/A;  start time of 1154.  See OR note  . BIOPSY BREAST    . BREAST REDUCTION SURGERY    . CYSTOSCOPY  08/23/2011   Procedure: CYSTOSCOPY;  Surgeon: Michael Litter, MD;  Location: [facility];  Service: Gynecology;  Laterality: N/A;  . DILATION AND CURETTAGE OF UTERUS    . fibroid tumor removed    . LAPAROSCOPIC HYSTERECTOMY  08/23/2011   Procedure: HYSTERECTOMY TOTAL LAPAROSCOPIC;   Surgeon: Michael Litter, MD;  Location: [facility];  Service: Gynecology;  Laterality: N/A;  . MYOMECTOMY ABDOMINAL APPROACH  1977  . TUMOR REMOVAL  1989   from ovary  . WISDOM TOOTH EXTRACTION      There were no vitals filed for this visit.          SLP Evaluation [facility] - 01/25/16 0001      SLP Visit Information   SLP Received On 01/25/16   Referring Provider Dr. Andee Poles   Onset Date 01/04/2016   Medical Diagnosis Hoarseness     Subjective   Subjective The patient is most bothered by poor singing   Patient/Family Stated Goal Sing     General Information   HPI 65 year old woman with lingering hoarseness and pain with singing following a cold.  The patient was evaluated by Dr. Andee Poles in June and August of this year with abnormal findings including interarytenoid pachydermia.      Prior Functional Status   Cognitive/Linguistic Baseline Within functional limits     Oral Motor/Sensory Function   Overall Oral Motor/Sensory Function Appears within functional limits for tasks assessed     Motor Speech   Overall Motor Speech Impaired   Respiration Impaired   Level of Impairment Conversation   Phonation Breathy;Hoarse;Low  vocal intensity   Resonance Within functional limits   Articulation Within functional limitis   Intelligibility Intelligible   Phonation Impaired   Vocal Abuses Prolonged Vocal Use   Tension Present Jaw;Neck;Shoulder   Volume Soft   Pitch Low     Standardized Assessments   Standardized Assessments  Other Assessment  Perceptual voice evaluation       Perceptual Voice Evaluation Voice checklist: . Health risks: GERD, allergies  . Characteristic voice use: patient states she talks and sings a lot  . Environmental risks: no significant environmental risks . Misuse: excessive talking/singing . Abuse: some coughing/throat clearing . Vocal characteristics: breathy, hoarse, limited voice range, poor vocal projection, excessive pharyngeal resonance Patient  quality of life survey: VHI-10: 10 (A score of 10 or higher indicate voice handicap) Maximum phonation time for sustained "ah": 8 seconds Average fundamental frequency during sustained "ah": 227 Hz Highest dynamic pitch when altering pitch from a low note to a high note: 481 Hz Lowest dynamic pitch when altering from a high note to a low note: 220 Hz Highest dynamic pitch in conversational speech: 260 Hz Lowest dynamic pitch in conversational speech: 175 Hz Average time patient was able to sustain /s/: 8 seconds Average time patient was able to sustain /z/: 6 seconds s/z ratio : 1.3 Visi-Pitch: Multi-Dimensional Voice Program (MDVP)  MDVPT extracts objective quantitative values (Relative Average Perturbation, Shimmer, Voice Turbulence Index, and Noise to Harmonic Ratio) on sustained phonation, which are displayed graphically and numerically in comparison to a built-in normative database.  The patient exhibited values within the norm for Relative Average Perturbation.  Average fundamental frequency was average for age and gender. Perceptually, her voice was muffled.  Education: Patient instructed in extrinsic laryngeal muscle stretches and breath support exercises      SLP Education - 01/25/16 1632    Education provided Yes   Education Details General outline of voice therapy   Person(s) Educated Patient   Methods Explanation   Comprehension Verbalized understanding            SLP Long Term Goals - 01/25/16 1635      SLP LONG TERM GOAL #1   Title The patient will demonstrate independent understanding of vocal hygiene concepts and extrinsic laryngeal muscle stretches.     Time 8   Period Weeks   Status New     SLP LONG TERM GOAL #2   Title The patient will be independent for abdominal breathing and breath support exercises.   Time 8   Period Weeks   Status New     SLP LONG TERM GOAL #3   Title The patient will maximize voice quality and loudness using breath support for  sustained vowel production, pitch glides, and hierarchal speech drill.   Time 8   Period Weeks   Status New     SLP LONG TERM GOAL #4   Title The patient will maximize voice quality and loudness using breath support for paragraph length recitation with 80% accuracy.   Time 8   Period Weeks   Status New          Plan - 01/25/16 1633    Clinical Impression Statement This 65 year old woman, with lingering hoarseness, is presenting with mild dysphonia.  The patient demonstrates hoarse/breathy vocal quality, excessive pharyngeal resonance, strained/tense phonation, limited pitch range, and laryngeal tension. She will benefit from voice therapy for education, to improve breath control, reduce laryngeal tension, and improve tone focus.   Speech Therapy Frequency 2x /  week   Duration Other (comment)  8 weeks   Potential to Achieve Goals Good   Potential Considerations Ability to learn/carryover information;Co-morbidities;Cooperation/participation level;Medical prognosis;Pain level;Previous level of function;Severity of impairments;Family/community support   SLP Home Exercise Plan extrinsic laryngeal muscle stretches and breath support exercises   Consulted and Agree with Plan of Care Patient      Patient will benefit from skilled therapeutic intervention in order to improve the following deficits and impairments:   Dysphonia - Plan: SLP plan of care cert/re-cert    Problem List Patient Active Problem List   Diagnosis Date Noted  . Cold intolerance 04/25/2012  . Vaginitis and vulvovaginitis 04/25/2012  . Intertriginous candidiasis 04/25/2012  . Hypertension   . Vaginal bleeding   . Leg mass, right   . Menopausal bleeding 06/19/2011  . Endometrial polyp 06/19/2011  . Ovarian cyst 06/19/2011  . Skin lesion 06/12/2011  . Leg mass 06/12/2011  . Obesity 05/31/2010  . GERD (gastroesophageal reflux disease) 05/31/2010  . STREPTOCOCCAL PHARYNGITIS 08/30/2009  . ACUTE PHARYNGITIS  08/30/2009  . BACK PAIN, THORACIC REGION, CHRONIC 06/23/2009  . PERIODONTAL DISEASE 04/09/2009  . HYPOKALEMIA, HX OF 04/09/2009  . Type II or unspecified type diabetes mellitus without mention of complication, not stated as uncontrolled 12/31/2008  . HYPERLIPIDEMIA 12/31/2008  . HEARING LOSS, MILD, BILATERAL 12/31/2008  . ESSENTIAL HYPERTENSION, BENIGN 12/31/2008  . HYPERTENSION 12/31/2008   Dollene Primrose, MS/CCC- SLP  Leandrew Koyanagi 01/25/2016, 4:38 PM  [place] [facility] MAIN [facility] SERVICES [address] [place], [place], [address] Phone: [phone]   Fax:  [phone]  Name: Gabrielle Green MRN: [id number] Date of Birth: [birth year]

## 2016-01-28 ENCOUNTER — Ambulatory Visit: Payer: BLUE CROSS/BLUE SHIELD | Admitting: Speech Pathology

## 2016-01-28 ENCOUNTER — Encounter: Payer: Self-pay | Admitting: Speech Pathology

## 2016-01-28 DIAGNOSIS — R490 Dysphonia: Secondary | ICD-10-CM

## 2016-01-28 NOTE — Therapy (Signed)
[place] MAIN [facility] SERVICES [address] [place], [place], [address] Phone: [phone]   Fax:  [phone]  Speech Language Pathology Treatment  Patient Details  Name: Gabrielle Green MRN: [id number] Date of Birth: 05/10/1950 Referring Provider: Dr. Pryor Ochoa  Encounter Date: 01/28/2016      End of Session - 01/28/16 1049    Visit Number 2   Number of Visits 17   Date for SLP Re-Evaluation 03/27/16   SLP Start Time 1005   SLP Stop Time  1050   SLP Time Calculation (min) 45 min   Activity Tolerance Patient tolerated treatment well      Past Medical History:  Diagnosis Date  . Arthritis   . Bacterial infection   . Cough   . Diabetes mellitus    on metformin qam-pt states was ordered bid-takes only once day. glyburide qam. pt states checks fasting occasionally, was 220 several days ago.  Marland Kitchen GERD (gastroesophageal reflux disease)    omeprazole prn  . H/O dysmenorrhea 2002  . H/O measles   . H/O menorrhagia 2002  . H/O scoliosis   . H/O uterine prolapse 2006  . H/O vaginal discharge 2001  . Hx of varicella   . Hyperlipidemia   . Hypertension   . Leaking of urine   . Leg mass, right   . Obesity   . PMB (postmenopausal bleeding) 04/13/2011  . PONV (postoperative nausea and vomiting)   . Vaginal bleeding   . Varicose veins   . Yeast infection     Past Surgical History:  Procedure Laterality Date  . ABDOMINAL HYSTERECTOMY  08/23/2011   Procedure: HYSTERECTOMY ABDOMINAL;  Surgeon: Betsy Coder, MD;  Location: [place] [facility];  Service: Gynecology;  Laterality: N/A;  start time of 1154.  See OR note  . BIOPSY BREAST    . BREAST REDUCTION SURGERY    . CYSTOSCOPY  08/23/2011   Procedure: CYSTOSCOPY;  Surgeon: Betsy Coder, MD;  Location: [place] [facility];  Service: Gynecology;  Laterality: N/A;  . DILATION AND CURETTAGE OF UTERUS    . fibroid tumor removed    . LAPAROSCOPIC HYSTERECTOMY  08/23/2011   Procedure: HYSTERECTOMY TOTAL LAPAROSCOPIC;   Surgeon: Betsy Coder, MD;  Location: [place] [facility];  Service: Gynecology;  Laterality: N/A;  . [place]  . TUMOR REMOVAL  1989   from ovary  . WISDOM TOOTH EXTRACTION      There were no vitals filed for this visit.      Subjective Assessment - 01/28/16 1048    Subjective "This is weird" regarding altering tongue placement to encourage forward resonance   Currently in Pain? No/denies               ADULT SLP TREATMENT - 01/28/16 0001      General Information   Behavior/Cognition Alert;Cooperative;Pleasant mood     Treatment Provided   Treatment provided Cognitive-Linquistic     Pain Assessment   Pain Assessment No/denies pain     Cognitive-Linquistic Treatment   Treatment focused on Voice   Skilled Treatment The patient was provided with written and verbal teaching regarding neck, shoulder, tongue, and throat stretches exercises to promote relaxed phonation. The patient reports that she is doing the stretches and finds them very helpful. The patient was provided with written and verbal teaching regarding breath support exercises.  Patient instructed in relaxed phonation / oral resonance.  The patient is not achieving true oral resonance, although voice quality is  much improved.  Explored alternate methods of teaching oral resonance including resonant flow, air flow, voice flow, liquid flow, and shaping short "e" in a different oral shape to allow a more forward placement of the sound.  Patient had best success with liquid flow.     Assessment / Recommendations / Plan   Plan Continue with current plan of care     Progression Toward Goals   Progression toward goals Progressing toward goals          SLP Education - 01/28/16 1049    Education provided Yes   Education Details Tongue placement to encourge forward flow   Person(s) Educated Patient   Methods Explanation   Comprehension Verbalized understanding            SLP Long Term Goals  - 01/25/16 1635      SLP LONG TERM GOAL #1   Title The patient will demonstrate independent understanding of vocal hygiene concepts and extrinsic laryngeal muscle stretches.     Time 8   Period Weeks   Status New     SLP LONG TERM GOAL #2   Title The patient will be independent for abdominal breathing and breath support exercises.   Time 8   Period Weeks   Status New     SLP LONG TERM GOAL #3   Title The patient will maximize voice quality and loudness using breath support for sustained vowel production, pitch glides, and hierarchal speech drill.   Time 8   Period Weeks   Status New     SLP LONG TERM GOAL #4   Title The patient will maximize voice quality and loudness using breath support for paragraph length recitation with 80% accuracy.   Time 8   Period Weeks   Status New          Plan - 01/28/16 1050    Clinical Impression Statement The patient is able to achieve improved oral resonance with altering tongue placement.  She achieved best resonance with liquid flow and was able to improve pitch glide with new shape for short "e".   Speech Therapy Frequency 2x / week   Duration Other (comment)   Treatment/Interventions Other (comment)  Voice therapy   Potential to Achieve Goals Good   Potential Considerations Ability to learn/carryover information;Co-morbidities;Cooperation/participation level;Medical prognosis;Pain level;Previous level of function;Severity of impairments;Family/community support   SLP Home Exercise Plan extrinsic laryngeal muscle stretches, breath support exercises, liquid flow, air flow, resonant flow, voice flow, shaping short e   Consulted and Agree with Plan of Care Patient      Patient will benefit from skilled therapeutic intervention in order to improve the following deficits and impairments:   Dysphonia    Problem List Patient Active Problem List   Diagnosis Date Noted  . Cold intolerance 04/25/2012  . Vaginitis and vulvovaginitis  04/25/2012  . Intertriginous candidiasis 04/25/2012  . Hypertension   . Vaginal bleeding   . Leg mass, right   . Menopausal bleeding 06/19/2011  . Endometrial polyp 06/19/2011  . Ovarian cyst 06/19/2011  . Skin lesion 06/12/2011  . Leg mass 06/12/2011  . Obesity 05/31/2010  . GERD (gastroesophageal reflux disease) 05/31/2010  . STREPTOCOCCAL PHARYNGITIS 08/30/2009  . ACUTE PHARYNGITIS 08/30/2009  . BACK PAIN, THORACIC REGION, CHRONIC 06/23/2009  . PERIODONTAL DISEASE 04/09/2009  . HYPOKALEMIA, HX OF 04/09/2009  . Type II or unspecified type diabetes mellitus without mention of complication, not stated as uncontrolled 12/31/2008  . HYPERLIPIDEMIA 12/31/2008  . HEARING LOSS, MILD,  BILATERAL 12/31/2008  . ESSENTIAL HYPERTENSION, BENIGN 12/31/2008  . HYPERTENSION 12/31/2008   Leroy Sea, MS/CCC- SLP  Lou Miner 01/28/2016, 10:53 AM  [place] MAIN [facility] SERVICES [address] [place], [place], [address] Phone: [phone]   Fax:  [phone]   Name: GWENNA DURIO MRN: [id number] Date of Birth: [birth year]

## 2016-02-01 ENCOUNTER — Ambulatory Visit: Payer: BLUE CROSS/BLUE SHIELD | Admitting: Speech Pathology

## 2016-02-01 ENCOUNTER — Encounter: Payer: Self-pay | Admitting: Speech Pathology

## 2016-02-01 DIAGNOSIS — R490 Dysphonia: Secondary | ICD-10-CM

## 2016-02-01 NOTE — Therapy (Signed)
[place] MAIN [facility] SERVICES [address] [place], [place], [address] Phone: [phone]   Fax:  [phone]  Speech Language Pathology Treatment  Patient Details  Name: NILOUFAR DWORSHAK MRN: [id number] Date of Birth: [birth year] Referring Provider: Dr. Pryor Ochoa  Encounter Date: 02/01/2016      End of Session - 02/01/16 1619    Visit Number 3   Number of Visits 17   Date for SLP Re-Evaluation 03/27/16   SLP Start Time 42   SLP Stop Time  1500   SLP Time Calculation (min) 50 min      Past Medical History:  Diagnosis Date  . Arthritis   . Bacterial infection   . Cough   . Diabetes mellitus    on metformin qam-pt states was ordered bid-takes only once day. glyburide qam. pt states checks fasting occasionally, was 220 several days ago.  Marland Kitchen GERD (gastroesophageal reflux disease)    omeprazole prn  . H/O dysmenorrhea 2002  . H/O measles   . H/O menorrhagia 2002  . H/O scoliosis   . H/O uterine prolapse 2006  . H/O vaginal discharge 2001  . Hx of varicella   . Hyperlipidemia   . Hypertension   . Leaking of urine   . Leg mass, right   . Obesity   . PMB (postmenopausal bleeding) 04/13/2011  . PONV (postoperative nausea and vomiting)   . Vaginal bleeding   . Varicose veins   . Yeast infection     Past Surgical History:  Procedure Laterality Date  . ABDOMINAL HYSTERECTOMY  08/23/2011   Procedure: HYSTERECTOMY ABDOMINAL;  Surgeon: Betsy Coder, MD;  Location: [place];  Service: Gynecology;  Laterality: N/A;  start time of 1154.  See OR note  . BIOPSY BREAST    . BREAST REDUCTION SURGERY    . CYSTOSCOPY  08/23/2011   Procedure: CYSTOSCOPY;  Surgeon: Betsy Coder, MD;  Location: [facility];  Service: Gynecology;  Laterality: N/A;  . DILATION AND CURETTAGE OF UTERUS    . fibroid tumor removed    . LAPAROSCOPIC HYSTERECTOMY  08/23/2011   Procedure: HYSTERECTOMY TOTAL LAPAROSCOPIC;  Surgeon: Betsy Coder, MD;  Location: [facility];   Service: Gynecology;  Laterality: N/A;  . [place]  . TUMOR REMOVAL  1989   from ovary  . WISDOM TOOTH EXTRACTION      There were no vitals filed for this visit.      Subjective Assessment - 02/01/16 1618    Subjective Patient reports she was able to sing without pain but continues to have reduced pitch range.   Currently in Pain? No/denies               ADULT SLP TREATMENT - 02/01/16 0001      General Information   Behavior/Cognition Alert;Cooperative;Pleasant mood     Treatment Provided   Treatment provided Cognitive-Linquistic     Pain Assessment   Pain Assessment No/denies pain     Cognitive-Linquistic Treatment   Treatment focused on Voice   Skilled Treatment The patient was provided with written and verbal teaching regarding neck, shoulder, tongue, and throat stretches exercises to promote relaxed phonation. The patient reports that she is doing the stretches and finds them very helpful. The patient was provided with written and verbal teaching regarding breath support exercises.  Patient instructed in relaxed phonation / oral resonance.  The patient is not achieving true oral resonance, although voice quality is much improved.  Initiated resonant voice therapy.  Patient with improved oral resonance and loudness syllable chanting, with inconsistent improvement in hum and hummed pitch glide.     Assessment / Recommendations / Plan   Plan Continue with current plan of care     Progression Toward Goals   Progression toward goals Progressing toward goals          SLP Education - 02/01/16 1619    Education provided Yes   Education Details Resonant voice therapy   Person(s) Educated Patient   Methods Explanation   Comprehension Verbalized understanding            SLP Long Term Goals - 01/25/16 1635      SLP LONG TERM GOAL #1   Title The patient will demonstrate independent understanding of vocal hygiene concepts and extrinsic  laryngeal muscle stretches.     Time 8   Period Weeks   Status New     SLP LONG TERM GOAL #2   Title The patient will be independent for abdominal breathing and breath support exercises.   Time 8   Period Weeks   Status New     SLP LONG TERM GOAL #3   Title The patient will maximize voice quality and loudness using breath support for sustained vowel production, pitch glides, and hierarchal speech drill.   Time 8   Period Weeks   Status New     SLP LONG TERM GOAL #4   Title The patient will maximize voice quality and loudness using breath support for paragraph length recitation with 80% accuracy.   Time 8   Period Weeks   Status New          Plan - 02/01/16 1619    Clinical Impression Statement The patient is able to achieve improved oral resonance with nasality (resonant therapy).  She achieved best resonance chanting syllables and is able to detect production with too much pharyngeal resonance.   Speech Therapy Frequency 2x / week   Duration Other (comment)   Treatment/Interventions Other (comment)  Voice therapy   Potential to Achieve Goals Good   Potential Considerations Ability to learn/carryover information;Co-morbidities;Cooperation/participation level;Medical prognosis;Pain level;Previous level of function;Severity of impairments;Family/community support   SLP Home Exercise Plan extrinsic laryngeal muscle stretches, breath support exercises, resonant voice exercises   Consulted and Agree with Plan of Care Patient      Patient will benefit from skilled therapeutic intervention in order to improve the following deficits and impairments:   Dysphonia    Problem List Patient Active Problem List   Diagnosis Date Noted  . Cold intolerance 04/25/2012  . Vaginitis and vulvovaginitis 04/25/2012  . Intertriginous candidiasis 04/25/2012  . Hypertension   . Vaginal bleeding   . Leg mass, right   . Menopausal bleeding 06/19/2011  . Endometrial polyp 06/19/2011  .  Ovarian cyst 06/19/2011  . Skin lesion 06/12/2011  . Leg mass 06/12/2011  . Obesity 05/31/2010  . GERD (gastroesophageal reflux disease) 05/31/2010  . STREPTOCOCCAL PHARYNGITIS 08/30/2009  . ACUTE PHARYNGITIS 08/30/2009  . BACK PAIN, THORACIC REGION, CHRONIC 06/23/2009  . PERIODONTAL DISEASE 04/09/2009  . HYPOKALEMIA, HX OF 04/09/2009  . Type II or unspecified type diabetes mellitus without mention of complication, not stated as uncontrolled 12/31/2008  . HYPERLIPIDEMIA 12/31/2008  . HEARING LOSS, MILD, BILATERAL 12/31/2008  . ESSENTIAL HYPERTENSION, BENIGN 12/31/2008  . HYPERTENSION 12/31/2008   Leroy Sea, MS/CCC- SLP  Lou Miner 02/01/2016, 4:20 PM  [place] MAIN [facility] SERVICES [phone]  [place], [place], [address] Phone: [phone]   Fax:  [phone]   Name: HILDAGARDE BRADSHER MRN: [id number] Date of Birth: 1951/02/07

## 2016-02-04 ENCOUNTER — Ambulatory Visit: Payer: BLUE CROSS/BLUE SHIELD | Admitting: Speech Pathology

## 2016-02-04 ENCOUNTER — Encounter: Payer: Self-pay | Admitting: Speech Pathology

## 2016-02-04 DIAGNOSIS — R490 Dysphonia: Secondary | ICD-10-CM | POA: Diagnosis not present

## 2016-02-04 NOTE — Therapy (Signed)
[place] MAIN [facility] SERVICES [address] [place], [place], [address] Phone: [phone]   Fax:  [phone]  Speech Language Pathology Treatment  Patient Details  Name: Gabrielle Green MRN: [id number] Date of Birth: 1950-12-22 Referring Provider: Dr. Pryor Ochoa  Encounter Date: 02/04/2016      End of Session - 02/04/16 1602    Visit Number 4   Number of Visits 17   Date for SLP Re-Evaluation 03/27/16   SLP Start Time 1500   SLP Stop Time  1550   SLP Time Calculation (min) 50 min      Past Medical History:  Diagnosis Date  . Arthritis   . Bacterial infection   . Cough   . Diabetes mellitus    on metformin qam-pt states was ordered bid-takes only once day. glyburide qam. pt states checks fasting occasionally, was 220 several days ago.  Marland Kitchen GERD (gastroesophageal reflux disease)    omeprazole prn  . H/O dysmenorrhea 2002  . H/O measles   . H/O menorrhagia 2002  . H/O scoliosis   . H/O uterine prolapse 2006  . H/O vaginal discharge 2001  . Hx of varicella   . Hyperlipidemia   . Hypertension   . Leaking of urine   . Leg mass, right   . Obesity   . PMB (postmenopausal bleeding) 04/13/2011  . PONV (postoperative nausea and vomiting)   . Vaginal bleeding   . Varicose veins   . Yeast infection     Past Surgical History:  Procedure Laterality Date  . ABDOMINAL HYSTERECTOMY  08/23/2011   Procedure: HYSTERECTOMY ABDOMINAL;  Surgeon: Betsy Coder, MD;  Location: [place] [facility];  Service: Gynecology;  Laterality: N/A;  start time of 1154.  See OR note  . BIOPSY BREAST    . BREAST REDUCTION SURGERY    . CYSTOSCOPY  08/23/2011   Procedure: CYSTOSCOPY;  Surgeon: Betsy Coder, MD;  Location: [place] [facility];  Service: Gynecology;  Laterality: N/A;  . DILATION AND CURETTAGE OF UTERUS    . fibroid tumor removed    . LAPAROSCOPIC HYSTERECTOMY  08/23/2011   Procedure: HYSTERECTOMY TOTAL LAPAROSCOPIC;  Surgeon: Betsy Coder, MD;  Location: [facility];   Service: Gynecology;  Laterality: N/A;  . [place]  . TUMOR REMOVAL  1989   from ovary  . WISDOM TOOTH EXTRACTION      There were no vitals filed for this visit.      Subjective Assessment - 02/04/16 1601    Subjective Patient reports she was able to sing without pain and has a little more pitch range.   Currently in Pain? No/denies               ADULT SLP TREATMENT - 02/04/16 0001      General Information   Behavior/Cognition Alert;Cooperative;Pleasant mood     Treatment Provided   Treatment provided Cognitive-Linquistic     Pain Assessment   Pain Assessment No/denies pain     Cognitive-Linquistic Treatment   Treatment focused on Voice   Skilled Treatment The patient was provided with written and verbal teaching regarding neck, shoulder, tongue, and throat stretches exercises to promote relaxed phonation. The patient reports that she is doing the stretches and finds them very helpful. The patient was provided with written and verbal teaching regarding breath support exercises.  Patient instructed in relaxed phonation / oral resonance.  The patient is not achieving true oral resonance, although voice quality is much improved.  Initiated resonant voice therapy.  Patient with improved oral resonance and loudness syllable chanting, with inconsistent improvement in hum and hummed pitch glide.  Added concept of voice building thorugh loudness.  Patient maintained good vocal quality with loudness in generating sentences.     Assessment / Recommendations / Plan   Plan Continue with current plan of care     Progression Toward Goals   Progression toward goals Progressing toward goals          SLP Education - 02/04/16 1602    Education provided Yes   Education Details Voice building strategy   Person(s) Educated Patient   Methods Explanation   Comprehension Verbalized understanding            SLP Long Term Goals - 01/25/16 1635      SLP  LONG TERM GOAL #1   Title The patient will demonstrate independent understanding of vocal hygiene concepts and extrinsic laryngeal muscle stretches.     Time 8   Period Weeks   Status New     SLP LONG TERM GOAL #2   Title The patient will be independent for abdominal breathing and breath support exercises.   Time 8   Period Weeks   Status New     SLP LONG TERM GOAL #3   Title The patient will maximize voice quality and loudness using breath support for sustained vowel production, pitch glides, and hierarchal speech drill.   Time 8   Period Weeks   Status New     SLP LONG TERM GOAL #4   Title The patient will maximize voice quality and loudness using breath support for paragraph length recitation with 80% accuracy.   Time 8   Period Weeks   Status New          Plan - 02/04/16 1604    Clinical Impression Statement The patient is able to achieve improved oral resonance with nasality (resonant therapy).  She achieved best resonance chanting syllables and is able to detect production with too much pharyngeal resonance.  She is also able to improve vocal quality and tone focus with loudness and good breath support.   Speech Therapy Frequency 2x / week   Duration Other (comment)   Treatment/Interventions Other (comment)  Voice therapy   Potential to Achieve Goals Good   Potential Considerations Ability to learn/carryover information;Co-morbidities;Cooperation/participation level;Medical prognosis;Pain level;Previous level of function;Severity of impairments;Family/community support   SLP Home Exercise Plan extrinsic laryngeal muscle stretches, breath support exercises, resonant voice exercises, voice building routine   Consulted and Agree with Plan of Care Patient      Patient will benefit from skilled therapeutic intervention in order to improve the following deficits and impairments:   Dysphonia    Problem List Patient Active Problem List   Diagnosis Date Noted  . Cold  intolerance 04/25/2012  . Vaginitis and vulvovaginitis 04/25/2012  . Intertriginous candidiasis 04/25/2012  . Hypertension   . Vaginal bleeding   . Leg mass, right   . Menopausal bleeding 06/19/2011  . Endometrial polyp 06/19/2011  . Ovarian cyst 06/19/2011  . Skin lesion 06/12/2011  . Leg mass 06/12/2011  . Obesity 05/31/2010  . GERD (gastroesophageal reflux disease) 05/31/2010  . STREPTOCOCCAL PHARYNGITIS 08/30/2009  . ACUTE PHARYNGITIS 08/30/2009  . BACK PAIN, THORACIC REGION, CHRONIC 06/23/2009  . PERIODONTAL DISEASE 04/09/2009  . HYPOKALEMIA, HX OF 04/09/2009  . Type II or unspecified type diabetes mellitus without mention of complication, not stated as uncontrolled 12/31/2008  . HYPERLIPIDEMIA 12/31/2008  .  HEARING LOSS, MILD, BILATERAL 12/31/2008  . ESSENTIAL HYPERTENSION, BENIGN 12/31/2008  . HYPERTENSION 12/31/2008   Leroy Sea, MS/CCC- SLP  Lou Miner 02/04/2016, 4:05 PM  [place] MAIN [facility] SERVICES [address] [place], [place], [address] Phone: [phone]   Fax:  [phone]   Name: Gabrielle Green MRN: [id number] Date of Birth: 08/07/1950

## 2016-02-07 ENCOUNTER — Ambulatory Visit: Payer: BLUE CROSS/BLUE SHIELD | Admitting: Speech Pathology

## 2016-02-09 ENCOUNTER — Ambulatory Visit: Payer: BLUE CROSS/BLUE SHIELD | Admitting: Speech Pathology

## 2016-02-09 DIAGNOSIS — R490 Dysphonia: Secondary | ICD-10-CM

## 2016-02-10 ENCOUNTER — Encounter: Payer: Self-pay | Admitting: Speech Pathology

## 2016-02-10 NOTE — Therapy (Signed)
[place] MAIN [facility] SERVICES [address] [place], [place], [address] Phone: [phone]   Fax:  [phone]  Speech Language Pathology Treatment/Discharge Summary  Patient Details  Name: Gabrielle Green MRN: [id number] Date of Birth: September 05, 1950 Referring Provider: Dr. Pryor Ochoa  Encounter Date: 02/09/2016      End of Session - 02/10/16 0843    Visit Number 5   Number of Visits 17   Date for SLP Re-Evaluation 03/27/16   SLP Start Time 7408   SLP Stop Time  1540   SLP Time Calculation (min) 37 min      Past Medical History:  Diagnosis Date  . Arthritis   . Bacterial infection   . Cough   . Diabetes mellitus    on metformin qam-pt states was ordered bid-takes only once day. glyburide qam. pt states checks fasting occasionally, was 220 several days ago.  Marland Kitchen GERD (gastroesophageal reflux disease)    omeprazole prn  . H/O dysmenorrhea 2002  . H/O measles   . H/O menorrhagia 2002  . H/O scoliosis   . H/O uterine prolapse 2006  . H/O vaginal discharge 2001  . Hx of varicella   . Hyperlipidemia   . Hypertension   . Leaking of urine   . Leg mass, right   . Obesity   . PMB (postmenopausal bleeding) 04/13/2011  . PONV (postoperative nausea and vomiting)   . Vaginal bleeding   . Varicose veins   . Yeast infection     Past Surgical History:  Procedure Laterality Date  . ABDOMINAL HYSTERECTOMY  08/23/2011   Procedure: HYSTERECTOMY ABDOMINAL;  Surgeon: Betsy Coder, MD;  Location: [place];  Service: Gynecology;  Laterality: N/A;  start time of 1154.  See OR note  . BIOPSY BREAST    . BREAST REDUCTION SURGERY    . CYSTOSCOPY  08/23/2011   Procedure: CYSTOSCOPY;  Surgeon: Betsy Coder, MD;  Location: [facility] [facility];  Service: Gynecology;  Laterality: N/A;  . DILATION AND CURETTAGE OF UTERUS    . fibroid tumor removed    . LAPAROSCOPIC HYSTERECTOMY  08/23/2011   Procedure: HYSTERECTOMY TOTAL LAPAROSCOPIC;  Surgeon: Betsy Coder, MD;   Location: [facility];  Service: Gynecology;  Laterality: N/A;  . [place]  . TUMOR REMOVAL  1989   from ovary  . WISDOM TOOTH EXTRACTION      There were no vitals filed for this visit.      Subjective Assessment - 02/10/16 0842    Subjective Patient reports she was able to sing without pain and has a little more pitch range.   Currently in Pain? No/denies               ADULT SLP TREATMENT - 02/10/16 0001      General Information   Behavior/Cognition Alert;Cooperative;Pleasant mood     Treatment Provided   Treatment provided Cognitive-Linquistic     Pain Assessment   Pain Assessment No/denies pain     Cognitive-Linquistic Treatment   Treatment focused on Voice   Skilled Treatment The patient was provided with written and verbal teaching regarding neck, shoulder, tongue, and throat stretches exercises to promote relaxed phonation. The patient reports that she is doing the stretches and finds them very helpful. The patient was provided with written and verbal teaching regarding breath support exercises.  Patient instructed in relaxed phonation / oral resonance.  The patient is not achieving true oral resonance, although voice quality is much improved.  Initiated resonant voice therapy.  Patient with improved oral resonance and loudness syllable chanting, with inconsistent improvement in hum and hummed pitch glide.  Added concept of voice building through loudness.  Patient maintained good vocal quality with loudness in conversation throughout the session.     Assessment / Recommendations / Plan   Plan Discharge SLP treatment due to (comment);All goals met     Progression Toward Goals   Progression toward goals Goals met, education completed, patient discharged from [facility] - 02/10/16 [phone]    Education provided Yes   Education Details Voice building strategy, You tube vocal warm up   Person(s) Educated Patient   Comprehension  Verbalized understanding            SLP Long Term Goals - 02/10/16 0844      SLP LONG TERM GOAL #1   Title The patient will demonstrate independent understanding of vocal hygiene concepts and extrinsic laryngeal muscle stretches.     Status Achieved     SLP LONG TERM GOAL #2   Title The patient will be independent for abdominal breathing and breath support exercises.   Status Achieved     SLP LONG TERM GOAL #3   Title The patient will maximize voice quality and loudness using breath support for sustained vowel production, pitch glides, and hierarchal speech drill.   Status Achieved     SLP LONG TERM GOAL #4   Title The patient will maximize voice quality and loudness using breath support for paragraph length recitation with 80% accuracy.   Status Achieved          Plan - 02/10/16 0843    Clinical Impression Statement The patient is able to achieve improved oral resonance with resonant therapy and vocal loudness/good breath support.  She is able to maintain good quality voice for more than 45 minutes conversation.  The patient reports that she is not coughing as frequently and coughing does not last very long when it occurs.  She reports no pain with singing and her voice sounds better when singing.  She continues to report limited pitch range.  She was advised to You Tube vocal warm up exercises to practice increasing her pitch range.   Speech Therapy Frequency Other (comment)  discharge   Duration Other (comment)  Discharge   Treatment/Interventions Other (comment)  Voice therapy   Potential to Achieve Goals Good   Potential Considerations Ability to learn/carryover information;Co-morbidities;Cooperation/participation level;Medical prognosis;Pain level;Previous level of function;Severity of impairments;Family/community support   SLP Home Exercise Plan extrinsic laryngeal muscle stretches, breath support exercises, resonant voice exercises, voice building routine, You Tube  vocal warm up   Consulted and Agree with Plan of Care Patient      Patient will benefit from skilled therapeutic intervention in order to improve the following deficits and impairments:   Dysphonia    Problem List Patient Active Problem List   Diagnosis Date Noted  . Cold intolerance 04/25/2012  . Vaginitis and vulvovaginitis 04/25/2012  . Intertriginous candidiasis 04/25/2012  . Hypertension   . Vaginal bleeding   . Leg mass, right   . Menopausal bleeding 06/19/2011  . Endometrial polyp 06/19/2011  . Ovarian cyst 06/19/2011  . Skin lesion 06/12/2011  . Leg mass 06/12/2011  . Obesity 05/31/2010  . GERD (gastroesophageal reflux disease) 05/31/2010  . STREPTOCOCCAL PHARYNGITIS 08/30/2009  . ACUTE PHARYNGITIS 08/30/2009  . BACK PAIN, THORACIC REGION, CHRONIC 06/23/2009  . PERIODONTAL DISEASE 04/09/2009  .  HYPOKALEMIA, HX OF 04/09/2009  . Type II or unspecified type diabetes mellitus without mention of complication, not stated as uncontrolled 12/31/2008  . HYPERLIPIDEMIA 12/31/2008  . HEARING LOSS, MILD, BILATERAL 12/31/2008  . ESSENTIAL HYPERTENSION, BENIGN 12/31/2008  . HYPERTENSION 12/31/2008   Leroy Sea, MS/CCC- SLP  Lou Miner 02/10/2016, 8:45 AM  [place] MAIN [facility] SERVICES [address] [place], [place], [address] Phone: [phone]   Fax:  [phone]   Name: TIPPI MCCRAE MRN: [id number] Date of Birth: [birth year]

## 2016-02-16 ENCOUNTER — Ambulatory Visit: Payer: BC Managed Care – PPO | Admitting: Speech Pathology

## 2016-02-22 ENCOUNTER — Ambulatory Visit: Payer: BC Managed Care – PPO | Admitting: Speech Pathology

## 2016-02-24 ENCOUNTER — Ambulatory Visit: Payer: BC Managed Care – PPO | Admitting: Speech Pathology

## 2017-10-23 ENCOUNTER — Ambulatory Visit (INDEPENDENT_AMBULATORY_CARE_PROVIDER_SITE_OTHER): Payer: Medicare Other | Admitting: Podiatry

## 2017-10-23 ENCOUNTER — Encounter: Payer: Self-pay | Admitting: Podiatry

## 2017-10-23 DIAGNOSIS — L989 Disorder of the skin and subcutaneous tissue, unspecified: Secondary | ICD-10-CM

## 2017-10-23 DIAGNOSIS — B351 Tinea unguium: Secondary | ICD-10-CM

## 2017-10-23 DIAGNOSIS — M79676 Pain in unspecified toe(s): Secondary | ICD-10-CM | POA: Diagnosis not present

## 2017-10-23 DIAGNOSIS — E0843 Diabetes mellitus due to underlying condition with diabetic autonomic (poly)neuropathy: Secondary | ICD-10-CM | POA: Diagnosis not present

## 2017-10-26 NOTE — Progress Notes (Signed)
    Subjective: Patient is a 67 y.o. female with PMHx of T2DM presenting to the office today as a new patient with a chief complaint of a painful callus lesions to the bilateral feet that have been present for the past several months. Walking and bearing weight increases the pain. She has not done anything for treatment at home.   Patient also complains of elongated, thickened nails that cause pain while ambulating in shoes. She is unable to trim her own nails. Patient presents today for further treatment and evaluation.  Past Medical History:  Diagnosis Date  . Arthritis   . Bacterial infection   . Cough   . Diabetes mellitus    on metformin qam-pt states was ordered bid-takes only once day. glyburide qam. pt states checks fasting occasionally, was 220 several days ago.  Marland Kitchen GERD (gastroesophageal reflux disease)    omeprazole prn  . H/O dysmenorrhea 2002  . H/O measles   . H/O menorrhagia 2002  . H/O scoliosis   . H/O uterine prolapse 2006  . H/O vaginal discharge 2001  . Hx of varicella   . Hyperlipidemia   . Hypertension   . Leaking of urine   . Leg mass, right   . Obesity   . PMB (postmenopausal bleeding) 04/13/2011  . PONV (postoperative nausea and vomiting)   . Vaginal bleeding   . Varicose veins   . Yeast infection     Objective:  Physical Exam General: Alert and oriented x3 in no acute distress  Dermatology: Hyperkeratotic lesions x 2 present on the bilateral feet. Pain on palpation with a central nucleated core noted. Skin is warm, dry and supple bilateral lower extremities. Negative for open lesions or macerations. Nails are tender, long, thickened and dystrophic with subungual debris, consistent with onychomycosis, 1-5 bilateral. No signs of infection noted.  Vascular: Palpable pedal pulses bilaterally. No edema or erythema noted. Capillary refill within normal limits.  Neurological: Epicritic and protective threshold diminished bilaterally.   Musculoskeletal  Exam: Pain on palpation at the keratotic lesion noted. Range of motion within normal limits bilateral. Muscle strength 5/5 in all groups bilateral.  Assessment: 1. Onychodystrophic nails 1-5 bilateral with hyperkeratosis of nails.  2. Onychomycosis of nail due to dermatophyte bilateral 3. Pre-ulcerative callus lesions x 2 to the bilateral feet    Plan of Care:  1. Patient evaluated. 2. Excisional debridement of keratoic lesion using a chisel blade was performed without incident.  3. Dressed with light dressing. 4. Mechanical debridement of nails 1-5 bilaterally performed using a nail nipper. Filed with dremel without incident.  5. Patient is to return to the clinic in 3 months.   Edrick Kins, DPM [facility]  Dr. Edrick Kins, [place]                                        [place], [place] [address]                Office [phone]  Fax [phone]

## 2018-01-04 ENCOUNTER — Ambulatory Visit: Payer: Medicare Other | Admitting: Podiatry

## 2018-01-18 ENCOUNTER — Ambulatory Visit: Payer: Medicare Other | Admitting: Podiatry

## 2018-01-29 ENCOUNTER — Ambulatory Visit (INDEPENDENT_AMBULATORY_CARE_PROVIDER_SITE_OTHER): Payer: Medicare Other | Admitting: Podiatry

## 2018-01-29 ENCOUNTER — Encounter: Payer: Self-pay | Admitting: Podiatry

## 2018-01-29 DIAGNOSIS — B351 Tinea unguium: Secondary | ICD-10-CM

## 2018-01-29 DIAGNOSIS — E0843 Diabetes mellitus due to underlying condition with diabetic autonomic (poly)neuropathy: Secondary | ICD-10-CM

## 2018-01-29 DIAGNOSIS — M79676 Pain in unspecified toe(s): Secondary | ICD-10-CM | POA: Diagnosis not present

## 2018-01-30 NOTE — Progress Notes (Signed)
   SUBJECTIVE Patient with a history of diabetes mellitus presents to office today complaining of elongated, thickened nails that cause pain while ambulating in shoes. She is unable to trim her own nails. Patient is here for further evaluation and treatment.   Past Medical History:  Diagnosis Date  . Arthritis   . Bacterial infection   . Cough   . Diabetes mellitus    on metformin qam-pt states was ordered bid-takes only once day. glyburide qam. pt states checks fasting occasionally, was 220 several days ago.  Marland Kitchen GERD (gastroesophageal reflux disease)    omeprazole prn  . H/O dysmenorrhea 2002  . H/O measles   . H/O menorrhagia 2002  . H/O scoliosis   . H/O uterine prolapse 2006  . H/O vaginal discharge 2001  . Hx of varicella   . Hyperlipidemia   . Hypertension   . Leaking of urine   . Leg mass, right   . Obesity   . PMB (postmenopausal bleeding) 04/13/2011  . PONV (postoperative nausea and vomiting)   . Vaginal bleeding   . Varicose veins   . Yeast infection     OBJECTIVE General Patient is awake, alert, and oriented x 3 and in no acute distress. Derm Skin is dry and supple bilateral. Negative open lesions or macerations. Remaining integument unremarkable. Nails are tender, long, thickened and dystrophic with subungual debris, consistent with onychomycosis, 1-5 bilateral. No signs of infection noted. Vasc  DP and PT pedal pulses palpable bilaterally. Temperature gradient within normal limits.  Neuro Epicritic and protective threshold sensation diminished bilaterally.  Musculoskeletal Exam No symptomatic pedal deformities noted bilateral. Muscular strength within normal limits.  ASSESSMENT 1. Diabetes Mellitus w/ peripheral neuropathy 2. Onychomycosis of nail due to dermatophyte bilateral 3. Pain in foot bilateral  PLAN OF CARE 1. Patient evaluated today. 2. Instructed to maintain good pedal hygiene and foot care. Stressed importance of controlling blood sugar.  3.  Mechanical debridement of nails 1-5 bilaterally performed using a nail nipper. Filed with dremel without incident.  4. Return to clinic in 3 mos.     Edrick Kins, DPM [facility]  Dr. Edrick Kins, [address]                                        [place], [place] [address]                Office [phone]  Fax [phone]

## 2018-04-30 ENCOUNTER — Ambulatory Visit: Payer: Medicare Other | Admitting: Podiatry

## 2019-01-02 ENCOUNTER — Other Ambulatory Visit: Payer: Self-pay

## 2019-01-02 ENCOUNTER — Encounter: Payer: Medicare Other | Attending: Family Medicine | Admitting: Registered"

## 2019-01-02 ENCOUNTER — Encounter: Payer: Self-pay | Admitting: Registered"

## 2019-01-02 DIAGNOSIS — E1165 Type 2 diabetes mellitus with hyperglycemia: Secondary | ICD-10-CM

## 2019-01-02 NOTE — Patient Instructions (Addendum)
Consider working on better sleep routine to help you get a good nights sleep. Try some of the tips on your handout. Sleepfoundation.org has a lot of information about sleep as well. Getting in a good sleep routine will help set you up for getting into a good eating routing and consistent time to take your insulin.  It sounds like you want to give up sweets, but you are concerned it may not be easy. Having a good foundation of quality sleep may help reduce cravings for sweets.   Consider aiming for balanced meals and snacks. For variety and ways to get in more vegetables, consider some of the recipe handouts.   Your MD probably wants to see you back no longer than after 3 months from the trial medication. May want to review dose and injection procedure with your doctor to make sure you are getting the most out of your [place].  Consider pursuing alternative exercise opportunities until you feel safe going back to the gym.  Consider setting up MyChart for ease of online communication if you have any questions or would like access to your after visit summary

## 2019-01-02 NOTE — Progress Notes (Signed)
Diabetes Self-Management Education  Visit Type: First/Initial  Appt. Start Time: 1115 Appt. End Time: 1245  01/02/2019  Gabrielle Green, identified by name and date of birth, is a 68 y.o. female with a diagnosis of Diabetes: Type 2.   ASSESSMENT  There were no vitals taken for this visit. There is no height or weight on file to calculate BMI.   Medication: Metformin, other DM medications were discontinued and pt reports she started [place] (sample pack) started with 4 units then increase to 6 after a couple of days. Pt reports she injects [place] when she gets up in the morning, can be anywhere from 7 - 10 am. Pt reports hasn't notice much difference in BG. Pt was concerned about effect of DM on kidneys, RD discussed action of SGLT-2i incase MD decides to try that medication and let Pt know she will need to drink more water and practice good hygiene.   SMBG: Pt states she checks FBG, brought in meter which shows average ~180 mg/dL. Pt was having trouble with getting meter through pharmacy so bought OTC relion meter.  Pt states sometimes doesn't feel like eating in the morning. Pt states she wanted ideas for beverages because she enjoys having something sweet to drink and doesn't like artifical sweeteners. Sometimes she drinks coke when has stomach acid to help her belch.  Pt states she hates cooking. For dinners if she starts on it early enough she will cook meat, veg, bread; if it is late may just have a hot dog.  Sleep: goes to bed 9 - 11 may not get to sleep until 11:30-12. Wakes up anywhere from 7 am - 10 am.  Wakes up ~3-6 am, sometimes watches TV or toss & turn to go back to sleep. Tried melatonin a couple of times and didn't help.  Stress: 6-7/10 mostly due to her son and house maintenance. Pt states it is situational and goes up and down. Pt states she is not interested in counseling for stress management feels she handles it fine.  Physical Acitivity: yard work. Pt reports she  was going to [employer] daily with sister before [place] and really enjoyed it. Pt reports she is thinking about ordering a stationery bike to have it at home.   GERD: pt states current medication doesn't always take care of acid reflux. Pt reports 2-3x/mo she vomits d/t acid on stomach. Was concerned that she is throwing up medication.  Pt is going to check with insurance before scheduling a follow-up appt.  Diabetes Self-Management Education - 01/02/19 1140      Visit Information   Visit Type  First/Initial      Initial Visit   Diabetes Type  Type 2    Are you currently following a meal plan?  No    Are you taking your medications as prescribed?  Yes   tresiba, metformin   Date Diagnosed  [place]   How would you rate your overall health?  Good      Psychosocial Assessment   Patient Belief/Attitude about Diabetes  Motivated to manage diabetes    How often do you need to have someone help you when you read instructions, pamphlets, or other written materials from your doctor or pharmacy?  1 - Never    What is the last grade level you completed in school?  college degree      Complications   Last HgB A1C per patient/outside source  10 %  per referral   How often do you check your blood sugar?  1-2 times/day    Fasting Blood glucose range (mg/dL)  180-200;>200    Have you had a dilated eye exam in the past 12 months?  Yes    Have you had a dental exam in the past 12 months?  No    Are you checking your feet?  Yes    How many days per week are you checking your feet?  7      Dietary Intake   Breakfast  none OR cereal OR oatmeal bacon OR bacon & eggs, toast, milk or _8 oz OJ    Snack (morning)  none OR apple    Lunch  sandwich, fries, sometiems a drink OR hotdog OR big salad    Snack (afternoon)  fruit OR crackers    Dinner  hot dog OR meat, veg, bread    Snack (evening)  cookies OR baked sweet potato    Beverage(s)  2-3 bottles 12 oz of water, OJ, milk, tea,  fruit drink, lemonade from powder mix      Exercise   Exercise Type  Light (walking / raking leaves)    How many days per week to you exercise?  1   yard work   How many minutes per day do you exercise?  60    Total minutes per week of exercise  60      Patient Education   Previous Diabetes Education  Yes (please comment)   25 yrs ago   Disease state   Definition of diabetes, type 1 and 2, and the diagnosis of diabetes    Nutrition management   Role of diet in the treatment of diabetes and the relationship between the three main macronutrients and blood glucose level    Physical activity and exercise   Role of exercise on diabetes management, blood pressure control and cardiac health.    Medications  Reviewed patients medication for diabetes, action, purpose, timing of dose and side effects.    Monitoring  Identified appropriate SMBG and/or A1C goals.    Chronic complications  Nephropathy, what it is, prevention of, the use of ACE, ARB's and early detection of through urine microalbumia.   use of SGLT2-i for DM and prev nephropathy     Individualized Goals (developed by patient)   Nutrition  General guidelines for healthy choices and portions discussed    Physical Activity  Exercise 3-5 times per week    Medications  take my medication as prescribed    Monitoring   test my blood glucose as discussed      Outcomes   Expected Outcomes  Demonstrated interest in learning. Expect positive outcomes    Future DMSE  PRN    Program Status  Completed       Individualized Plan for Diabetes Self-Management Training:   Learning Objective:  Patient will have a greater understanding of diabetes self-management. Patient education plan is to attend individual and/or group sessions per assessed needs and concerns.   Patient Instructions  Consider working on better sleep routine to help you get a good nights sleep. Try some of the tips on your handout. Sleepfoundation.org has a lot of  information about sleep as well.  It sounds like you want to give up sweets, but you are concerned it may not be easy. Having a good foundation of quality sleep may help reduce cravings for sweets.   Consider aiming for balanced meals and snacks.  Your MD probably wants to see you back no longer than after 3 months from the trial medication. May want to review dose and injection procedure with your doctor to make sure you are getting the most out of your [place].  Consider pursuing alternative exercise opportunities until you feel safe going back to the gym.    Expected Outcomes:  Demonstrated interest in learning. Expect positive outcomes  Education material provided: A1C conversion sheet, Meal plan card and Snack sheet, Sleep hygiene, [employer] brochure, recipe ideas for grain bowls, salads, smoothies  If problems or questions, patient to contact team via:  Phone and MyChart  Future DSME appointment: PRN

## 2022-09-22 ENCOUNTER — Other Ambulatory Visit: Payer: Self-pay | Admitting: Otolaryngology

## 2022-09-22 DIAGNOSIS — K219 Gastro-esophageal reflux disease without esophagitis: Secondary | ICD-10-CM

## 2022-09-22 DIAGNOSIS — R1310 Dysphagia, unspecified: Secondary | ICD-10-CM

## 2022-09-22 DIAGNOSIS — R490 Dysphonia: Secondary | ICD-10-CM

## 2022-09-27 ENCOUNTER — Ambulatory Visit
Admission: RE | Admit: 2022-09-27 | Discharge: 2022-09-27 | Disposition: A | Discharge status: Home / Self Care | Payer: Medicare Other | Source: Ambulatory Visit | Attending: Otolaryngology | Admitting: Otolaryngology

## 2022-09-27 DIAGNOSIS — K219 Gastro-esophageal reflux disease without esophagitis: Secondary | ICD-10-CM | POA: Insufficient documentation

## 2022-09-27 DIAGNOSIS — R1310 Dysphagia, unspecified: Secondary | ICD-10-CM | POA: Diagnosis present

## 2022-09-27 DIAGNOSIS — R490 Dysphonia: Secondary | ICD-10-CM | POA: Insufficient documentation

## 2022-09-27 NOTE — Progress Notes (Addendum)
Modified Barium Swallow Study  Patient Details  Name: Gabrielle Green MRN: [id number] Date of Birth: 08-11-1950  Today's Date: 09/27/2022  Modified Barium Swallow completed.  Full report located under Chart Review in the Imaging Section.  History of Present Illness Pt is a 72 yo female w/ PMH including GERD, HTN, DM type 2 with diabetic peripheral neuropathy, Type 2 diabetes mellitus with stage 3a chronic kidney disease, with long-term current use of insulin, Morbid obesity, Gastritis.  She is on a PPI 1x daily and famotidine (PEPCID) 1x daily.  She was recently evaluated by ENT who stated "her throat is reddened".  Pt endorsed several episodes/years of vomiting d/t a medication she was taking several years ago.   Currently, she endorses infrequent occasions of coughing w/ po's and overt s/s of REFLUX activity including Esophageal Dysmotility (pointed to mid-sternum area stating she feels foods get "stuck here").   Pt has No recent Chest Imaging (last imaging was in 2015); she Denies any tx for URI/pneumonia by MD.  No recent changes in diet nor unintended weight loss.   Clinical Impression Patient presents with functional oropharyngeal phase swallowing for age and in setting of Baseline GERD/REFLUX - pt is on a PPI. No laryngeal penetration nor aspiration noted during this study today.    Oral stage is characterized by adequate lip closure, bolus preparation and containment, and anterior to posterior transit. Complete oral clearing achieved b/t trials; bolus piecemeal noted w/ increased texture and cleared w/ f/u swallow(independent). Pt is Missing Dentition, Molars. Swallow initiation occurs at the level of the BOT-valleculae for majority of consistencies; along posterior laryngeal surface of the epiglottis for thin liquids -- suspect potential impact from exposure to REFLUX activity on sensation initiation of swallow.  Pharyngeal stage is noted for adequate tongue base retraction, adequate  hyolaryngeal excursion, and adequate pharyngeal constriction. Epiglottic deflection is complete; there is No penetration nor aspiration. Any slight, diffuse pharyngeal residue is cleared w/ a f/u swallow(independent). Pharyngeal stripping wave is complete.  Amplitude/duration of cricopharyngeus opening is WFL. There is adequate/complete clearance through the cervical esophagus. Min bony protrusions noted ("bumpy" path) along viewable Cervical Esophagus. A brief Esophageal sweep was performed in the upright lateral position which revealed min satsis of the13 mm barium tablet given in Puree -- suspect this could be related to pt's c/o of foods being "stuck" in mid-sternum area.   Consistencies tested were thin liquids x2 tsps, 1 cup sip, 3 sequential sips, nectar x1 tsp, 1 cup sip, 2 sequential cup sips, honey x1 tsp, pudding x1 tsp, regular solid (1/2 graham cracker with pudding), and 13 mm barium tablet with Puree(pt did not report a globus sensation w/ tablet stasis).  Recommend patient continue regular diet with thin liquids; viewed Video together and educated pt verbally re: strategies including moistening dry foods/meats, alternating solids and liquids, small bites of foods and thorough chewing d/t Missing Dentition/molars. No further ST indicated. Factors that may increase risk of adverse event in presence of aspiration Rubye Oaks & Clearance Coots 2021):  (GERD/REFLUX)   Swallow Evaluation Recommendations Recommendations: PO diet PO Diet Recommendation: Regular;Thin liquids (Level 0) (cut Small, moistened well) Liquid Administration via: Cup Medication Administration: Whole meds with puree (as needed for ease of swallowing d/t c/o difficulty swallowing "some" Pills) Supervision: Patient able to self-feed Swallowing strategies  : Minimize environmental distractions;Slow rate;Small bites/sips;Follow solids with liquids;Chew foods well b/f swallowing Postural changes: Position pt fully upright for meals;Stay  upright 30-60 min after meals (REFLUX precautions) Oral care recommendations: Oral  care BID (2x/day);Pt independent with oral care Recommended consults: Consider GI consultation;Consider esophageal assessment       Jerilynn Som, MS, CCC-SLP Speech Language Pathologist Rehab Services; [facility] [phone] (ascom) Rosha Cocker 09/27/2022,6:24 PM

## 2022-11-22 LAB — EXTERNAL GENERIC LAB PROCEDURE: COLOGUARD: NEGATIVE

## 2022-11-22 LAB — COLOGUARD: COLOGUARD: NEGATIVE

## 2023-06-29 ENCOUNTER — Ambulatory Visit (INDEPENDENT_AMBULATORY_CARE_PROVIDER_SITE_OTHER)

## 2023-06-29 ENCOUNTER — Encounter: Payer: Self-pay | Admitting: Podiatry

## 2023-06-29 ENCOUNTER — Ambulatory Visit: Admitting: Podiatry

## 2023-06-29 DIAGNOSIS — M7741 Metatarsalgia, right foot: Secondary | ICD-10-CM

## 2023-06-29 NOTE — Progress Notes (Signed)
 Chief Complaint  Patient presents with   Foot Pain    "I think I broke a bone in my right foot." N - broke foot L - 4th met right D - 2 weeks O - suddenly, dropped a can on my foot C - sore, aches A - shoes, sometime standing T - none    HPI: 73 y.o. female presenting today as a reestablish new patient for evaluation of pain and tenderness associated to the right fourth toe.    Brief history: She says that about 2 weeks ago, end of April 2025, she dropped a cane on her right foot.  She thought that the pain would go away after few days but has been persistently painful.  She presents for further treatment evaluation  Past Medical History:  Diagnosis Date   Arthritis    Bacterial infection    Cough    Diabetes mellitus    on metformin  qam-pt states was ordered bid-takes only once day. glyburide  qam. pt states checks fasting occasionally, was 220 several days ago.   GERD (gastroesophageal reflux disease)    omeprazole  prn   H/O dysmenorrhea 2002   H/O measles    H/O menorrhagia 2002   H/O scoliosis    H/O uterine prolapse 2006   H/O vaginal discharge 2001   Hx of varicella    Hyperlipidemia    Hypertension    Leaking of urine    Leg mass, right    Obesity    PMB (postmenopausal bleeding) 04/13/2011   PONV (postoperative nausea and vomiting)    Vaginal bleeding    Varicose veins    Yeast infection     Past Surgical History:  Procedure Laterality Date   ABDOMINAL HYSTERECTOMY  08/23/2011   Procedure: HYSTERECTOMY ABDOMINAL;  Surgeon: Norville Beery, MD;  Location: [facility];  Service: Gynecology;  Laterality: N/A;  start time of 1154.  See OR note   BIOPSY BREAST     BREAST REDUCTION SURGERY     CYSTOSCOPY  08/23/2011   Procedure: CYSTOSCOPY;  Surgeon: Norville Beery, MD;  Location: [facility];  Service: Gynecology;  Laterality: N/A;   DILATION AND CURETTAGE OF UTERUS     fibroid tumor removed     LAPAROSCOPIC HYSTERECTOMY  08/23/2011   Procedure: HYSTERECTOMY TOTAL  LAPAROSCOPIC;  Surgeon: Norville Beery, MD;  Location: [facility];  Service: Gynecology;  Laterality: N/A;   MYOMECTOMY ABDOMINAL APPROACH  1977   TUMOR REMOVAL  1989   from ovary   WISDOM TOOTH EXTRACTION      Allergies  Allergen Reactions   Penicillins Nausea And Vomiting and Cough     Physical Exam: General: The patient is alert and oriented x3 in no acute distress.  Dermatology: Skin is warm, dry and supple bilateral lower extremities.   Vascular: Palpable pedal pulses bilaterally. Capillary refill within normal limits.  Mild edema noted around the fourth MTP of the right foot.  No ecchymosis.  No erythema.  Neurological: Grossly intact via light touch  Musculoskeletal Exam: No pedal deformities noted.  There is tenderness and pain with palpation to the fourth MTP and fourth metatarsal head of the right foot.  Mild edema noted localized to this area as well  Radiographic Exam RT foot 06/29/2023:  Joint spaces preserved.  There does appear to be a chronic appearing spiral oblique fracture of the proximal phalanx of the right fourth toe.  This appears to be old and not acute.  No other fractures identified  Assessment/Plan of Care: 1.  Contusion fourth MTP right foot; DOI: end of April, 2025  -Patient evaluated.  X-rays reviewed -Recommend RICE -Recommend OTC Aleve twice daily -Postop shoe dispensed.  WBAT -Return to clinic 4 weeks       Dot Gazella, DPM [facility]  Dr. Dot Gazella, DPM    [address] [place], [place] [address]                Office [phone]  Fax [phone]

## 2023-07-31 ENCOUNTER — Ambulatory Visit: Admitting: Podiatry

## 2023-08-07 ENCOUNTER — Other Ambulatory Visit: Payer: Self-pay | Admitting: Nephrology

## 2023-08-07 DIAGNOSIS — R8290 Unspecified abnormal findings in urine: Secondary | ICD-10-CM

## 2023-08-07 DIAGNOSIS — N1832 Chronic kidney disease, stage 3b: Secondary | ICD-10-CM

## 2023-08-07 DIAGNOSIS — R809 Proteinuria, unspecified: Secondary | ICD-10-CM

## 2023-08-14 ENCOUNTER — Ambulatory Visit
Admission: RE | Admit: 2023-08-14 | Discharge: 2023-08-14 | Disposition: A | Discharge status: Home / Self Care | Source: Ambulatory Visit | Attending: Nephrology | Admitting: Nephrology

## 2023-08-14 DIAGNOSIS — R809 Proteinuria, unspecified: Secondary | ICD-10-CM | POA: Insufficient documentation

## 2023-08-14 DIAGNOSIS — N1832 Chronic kidney disease, stage 3b: Secondary | ICD-10-CM | POA: Insufficient documentation

## 2023-08-14 DIAGNOSIS — R8290 Unspecified abnormal findings in urine: Secondary | ICD-10-CM | POA: Diagnosis present

## 2023-08-17 ENCOUNTER — Ambulatory Visit: Admitting: Podiatry

## 2023-08-17 ENCOUNTER — Encounter: Payer: Self-pay | Admitting: Podiatry

## 2023-08-17 VITALS — Ht 65.0 in | Wt 271.0 lb

## 2023-08-17 DIAGNOSIS — M7741 Metatarsalgia, right foot: Secondary | ICD-10-CM | POA: Diagnosis not present

## 2023-08-17 NOTE — Progress Notes (Signed)
 Chief Complaint  Patient presents with   Foot Pain    Pt is here to f/u on right foot due to pain, she states her foot feels a lot better then it did before her last office visit.    HPI: 73 y.o. female presenting today for follow-up evaluation of an injury to the right forefoot.  She says that she feels significantly better.  She no longer has any pain or tenderness.  Brief history: She says that about 2 weeks ago, end of April 2025, she dropped a cane on her right foot.  She thought that the pain would go away after few days but has been persistently painful.  She presents for further treatment evaluation  Past Medical History:  Diagnosis Date   Arthritis    Bacterial infection    Cough    Diabetes mellitus    on metformin  qam-pt states was ordered bid-takes only once day. glyburide  qam. pt states checks fasting occasionally, was 220 several days ago.   GERD (gastroesophageal reflux disease)    omeprazole  prn   H/O dysmenorrhea 2002   H/O measles    H/O menorrhagia 2002   H/O scoliosis    H/O uterine prolapse 2006   H/O vaginal discharge 2001   Hx of varicella    Hyperlipidemia    Hypertension    Leaking of urine    Leg mass, right    Obesity    PMB (postmenopausal bleeding) 04/13/2011   PONV (postoperative nausea and vomiting)    Vaginal bleeding    Varicose veins    Yeast infection     Past Surgical History:  Procedure Laterality Date   ABDOMINAL HYSTERECTOMY  08/23/2011   Procedure: HYSTERECTOMY ABDOMINAL;  Surgeon: Ovid DELENA All, MD;  Location: [facility];  Service: Gynecology;  Laterality: N/A;  start time of 1154.  See OR note   BIOPSY BREAST     BREAST REDUCTION SURGERY     CYSTOSCOPY  08/23/2011   Procedure: CYSTOSCOPY;  Surgeon: Ovid DELENA All, MD;  Location: [facility];  Service: Gynecology;  Laterality: N/A;   DILATION AND CURETTAGE OF UTERUS     fibroid tumor removed     LAPAROSCOPIC HYSTERECTOMY  08/23/2011   Procedure: HYSTERECTOMY TOTAL LAPAROSCOPIC;   Surgeon: Ovid DELENA All, MD;  Location: [facility];  Service: Gynecology;  Laterality: N/A;   MYOMECTOMY ABDOMINAL APPROACH  1977   TUMOR REMOVAL  1989   from ovary   WISDOM TOOTH EXTRACTION      Allergies  Allergen Reactions   Penicillins Nausea And Vomiting and Cough     Physical Exam: General: The patient is alert and oriented x3 in no acute distress.  Dermatology: Skin is warm, dry and supple bilateral lower extremities.   Vascular: Palpable pedal pulses bilaterally. Capillary refill within normal limits.  Mild edema noted around the fourth MTP of the right foot.  No ecchymosis.  No erythema.  Neurological: Grossly intact via light touch  Musculoskeletal Exam: No pedal deformities noted.  Negative for any appreciable tenderness or pain with palpation to the fourth MTP and fourth metatarsal head of the right foot.  Mild edema noted localized to this area as well  Radiographic Exam RT foot 06/29/2023:  Joint spaces preserved.  There does appear to be a chronic appearing spiral oblique fracture of the proximal phalanx of the right fourth toe.  This appears to be old and not acute.  No other fractures identified  Assessment/Plan of Care: 1.  Contusion fourth  MTP right foot; DOI: end of April, 2025  -Patient evaluated.   -Discontinue postop shoe.  Recommend good supportive tennis shoes and sneakers.  Refrain from going barefoot -Patient may now resume full activity no restrictions -Return to clinic PRN      Thresa EMERSON Sar, DPM [facility]  Dr. Thresa EMERSON Sar, DPM    [address] [place], [place] [address]                Office [phone]  Fax [phone]
